# Patient Record
Sex: Female | Born: 1945 | Race: White | Hispanic: No | Smoking: Former smoker
Health system: Southern US, Community
[De-identification: ages and names within clinical notes are randomized; demographics above are authoritative.]

## PROBLEM LIST (undated history)

## (undated) DIAGNOSIS — E079 Disorder of thyroid, unspecified: Secondary | ICD-10-CM

## (undated) DIAGNOSIS — M1990 Unspecified osteoarthritis, unspecified site: Secondary | ICD-10-CM

## (undated) DIAGNOSIS — R0600 Dyspnea, unspecified: Secondary | ICD-10-CM

## (undated) DIAGNOSIS — J45909 Unspecified asthma, uncomplicated: Secondary | ICD-10-CM

## (undated) DIAGNOSIS — R7303 Prediabetes: Secondary | ICD-10-CM

## (undated) DIAGNOSIS — I10 Essential (primary) hypertension: Secondary | ICD-10-CM

## (undated) DIAGNOSIS — I499 Cardiac arrhythmia, unspecified: Secondary | ICD-10-CM

## (undated) DIAGNOSIS — C801 Malignant (primary) neoplasm, unspecified: Secondary | ICD-10-CM

## (undated) DIAGNOSIS — Z9889 Other specified postprocedural states: Secondary | ICD-10-CM

## (undated) DIAGNOSIS — R112 Nausea with vomiting, unspecified: Secondary | ICD-10-CM

## (undated) DIAGNOSIS — T8859XA Other complications of anesthesia, initial encounter: Secondary | ICD-10-CM

## (undated) DIAGNOSIS — F419 Anxiety disorder, unspecified: Secondary | ICD-10-CM

## (undated) DIAGNOSIS — E039 Hypothyroidism, unspecified: Secondary | ICD-10-CM

## (undated) DIAGNOSIS — E785 Hyperlipidemia, unspecified: Secondary | ICD-10-CM

## (undated) HISTORY — PX: THYROIDECTOMY: SHX17

## (undated) HISTORY — PX: COLONOSCOPY: SHX174

## (undated) HISTORY — PX: OTHER SURGICAL HISTORY: SHX169

---

## 2011-05-10 ENCOUNTER — Ambulatory Visit: Payer: Self-pay | Admitting: Otolaryngology

## 2011-05-10 DIAGNOSIS — I10 Essential (primary) hypertension: Secondary | ICD-10-CM

## 2011-05-14 ENCOUNTER — Ambulatory Visit: Payer: Self-pay | Admitting: Otolaryngology

## 2011-05-16 LAB — PATHOLOGY REPORT

## 2013-02-03 ENCOUNTER — Ambulatory Visit: Payer: Self-pay | Admitting: Gastroenterology

## 2013-02-04 LAB — PATHOLOGY REPORT

## 2013-05-05 ENCOUNTER — Ambulatory Visit: Payer: BC Managed Care – PPO | Admitting: Internal Medicine

## 2015-08-11 ENCOUNTER — Ambulatory Visit: Payer: Medicare Other | Admitting: Occupational Therapy

## 2015-08-18 ENCOUNTER — Ambulatory Visit: Payer: Medicare Other | Attending: Rheumatology | Admitting: Occupational Therapy

## 2015-08-18 DIAGNOSIS — M6281 Muscle weakness (generalized): Secondary | ICD-10-CM

## 2015-08-18 DIAGNOSIS — M25632 Stiffness of left wrist, not elsewhere classified: Secondary | ICD-10-CM | POA: Diagnosis present

## 2015-08-18 DIAGNOSIS — M25641 Stiffness of right hand, not elsewhere classified: Secondary | ICD-10-CM | POA: Diagnosis present

## 2015-08-18 DIAGNOSIS — M79641 Pain in right hand: Secondary | ICD-10-CM | POA: Insufficient documentation

## 2015-08-18 DIAGNOSIS — M25532 Pain in left wrist: Secondary | ICD-10-CM | POA: Diagnosis present

## 2015-08-18 NOTE — Patient Instructions (Signed)
Contrast bath 3 min heat , 1 min ice x 2 and heat 3 min bilateral hands Tendon glides x 8 Med N glides  X 5  Joint protection and AE education done and hand out provided  Splint wearing for wrist  MC block splint for R 4th to use at night and composite fist use

## 2015-08-18 NOTE — Therapy (Signed)
[facility] [address], [place], [address] Phone: [phone]   Fax:  [phone]  Occupational Therapy Treatment  Patient Details  Name: Brittany Wheeler MRN: [id number] Date of Birth: May 01, 1946 Referring Provider: Jefm Bryant  Encounter Date: 08/18/2015      OT End of Session - 08/18/15 1453    Visit Number 1   Number of Visits 6   Date for OT Re-Evaluation 09/08/15   OT Start Time 0850   OT Stop Time 0946   OT Time Calculation (min) 56 min   Activity Tolerance Patient tolerated treatment well   Behavior During Therapy [facility] for tasks assessed/performed      No past medical history on file.  No past surgical history on file.  There were no vitals filed for this visit.  Visit Diagnosis:  Pain of right hand - Plan: Ot plan of care cert/re-cert  Pain in wrist joint, left - Plan: Ot plan of care cert/re-cert  Stiffness of finger joint of right hand - Plan: Ot plan of care cert/re-cert  Stiffness of left wrist joint - Plan: Ot plan of care cert/re-cert  Muscle weakness - Plan: Ot plan of care cert/re-cert      Subjective Assessment - 08/18/15 1434    Subjective  Pain in my R hand and wrist started about May 2016 , and then my L wrist too - but it feels better after the shot Dr Jefm Bryant gave me 27th Dec -and bilateral wrist splints -  my finger did not lock since then until this am quickly -    Patient Stated Goals Want my pain in hands and wrist to be better and not come back - get my range and strength better    Currently in Pain? Yes   Pain Score 1    Pain Location Finger (Comment which one)   Pain Orientation Right   Pain Descriptors / Indicators Aching   Pain Type Chronic pain   Pain Onset 1 to 4 weeks ago   Pain Frequency Constant            [facility] OT Assessment - 08/18/15 0001    Assessment   Diagnosis Trigger finger R 4th , bilateral CTS , pain in hands and wrists   Referring  Provider kernodle   Onset Date 12/05/14   Assessment Pt had pain and locking of R ring finger since May of last year - seen Dr Jefm Bryant last month and had shot for trigger finger and blateral wrist splints for CTS - pt refer to hand thepay - pain better  but still present   Balance Screen   Has the patient fallen in the past 6 months No   Has the patient had a decrease in activity level because of a fear of falling?  No   Is the patient reluctant to leave their home because of a fear of falling?  No   Home  Environment   Lives With Alone   Prior Function   Level of Independence Independent   Leisure R hand dominant, play on her IPAD, walk her 2 dogs, house work ,   AROM   Right Wrist Extension 63 Degrees   Right Wrist Flexion 65 Degrees   Right Wrist Radial Deviation 20 Degrees   Right Wrist Ulnar Deviation 30 Degrees   Left Wrist Extension 58 Degrees   Left Wrist Flexion 50 Degrees   Left Wrist Radial Deviation 10 Degrees   Left Wrist  Ulnar Deviation 30 Degrees   Strength   Right Hand Grip (lbs) 35   Right Hand Lateral Pinch 11 lbs   Right Hand 3 Point Pinch 7 lbs   Left Hand Grip (lbs) 42   Left Hand Lateral Pinch 13 lbs   Left Hand 3 Point Pinch 9 lbs   Right Hand AROM   R Index  MCP 0-90 66 Degrees   R Long  MCP 0-90 60 Degrees   R Ring  MCP 0-90 70 Degrees   R Little  MCP 0-90 78 Degrees   Left Hand AROM   L Index  MCP 0-90 65 Degrees   L Long  MCP 0-90 85 Degrees   L Ring  MCP 0-90 85 Degrees   L Little  MCP 0-90 85 Degrees         Contrast done to L wrist , and R hand prior to review of HEP to decrease pain and increase ROM  Ed on HEP                  OT Education - 08-23-2015 1452    Education provided Yes   Person(s) Educated Patient   Methods Explanation;Demonstration;Tactile cues;Verbal cues;Handout   Comprehension Verbal cues required;Returned demonstration;Verbalized understanding          OT Short Term Goals - 08-23-15 1457    OT  SHORT TERM GOAL #1   Title Pain on PRWHE improve with 10 points on PRWHE    Baseline Pain on PRWHE for pain at eval was 25/50   Time 2   Period Weeks   Status New   OT SHORT TERM GOAL #2   Title Pt to be ind in HEP to decrease pain and increase ROM at L wrist and R MC's to cut food and turn doorknob with more ease    Baseline no knowledge on HEP   Time 2   Period Weeks   Status New           OT Long Term Goals - 08/23/2015 1458    OT LONG TERM GOAL #1   Title Pt verbalize 3 joint protection principles and AE to increase ease and decrease pain use hands in ADL's    Baseline very litte knowledge    Time 3   Period Weeks   Status New               Plan - 08/23/2015 1454    Clinical Impression Statement Pt present with increase pain in L wrist, R hand - mostly 4th over A1 pulley , MC's - pt present with decrease AROM in R hand diigits and L wrist - as well as decrease grip and prehension - all limiting her functional use of L and R hand in ADL's and IADL's    Pt will benefit from skilled therapeutic intervention in order to improve on the following deficits (Retired) Decreased strength;Impaired UE functional use;Pain;Impaired flexibility;Decreased range of motion   Rehab Potential Fair   OT Frequency 2x / week   OT Duration 2 weeks   OT Treatment/Interventions Self-care/ADL training;Parrafin;Ultrasound;Contrast Bath;DME and/or AE instruction;Splinting;Patient/family education;Therapeutic exercises;Manual Therapy   Plan Assess pain , ROM improve and if implement joint protection    OT Home Exercise Plan see pt instruction   Consulted and Agree with Plan of Care Patient          G-Codes - August 23, 2015 1500    Functional Assessment Tool Used ROM , strength, pain scale, PRWHE , clinical judgement  Functional Limitation Self care   Self Care Current Status [phone]) At least 40 percent but less than 60 percent impaired, limited or restricted   Self Care Goal Status ([id number]) At  least 1 percent but less than 20 percent impaired, limited or restricted      Problem List There are no active problems to display for this patient.   Rosalyn Gess OTR/L,CLT 08/18/2015, 3:03 PM  [facility] [address] [place], [address] Phone: [phone]   Fax:  [phone]  Name: Brittany Wheeler MRN: [id number] Date of Birth: 10-22-1945

## 2015-08-24 ENCOUNTER — Ambulatory Visit: Payer: Medicare Other | Admitting: Occupational Therapy

## 2015-08-26 ENCOUNTER — Ambulatory Visit: Payer: Medicare Other | Admitting: Occupational Therapy

## 2015-08-26 DIAGNOSIS — M25641 Stiffness of right hand, not elsewhere classified: Secondary | ICD-10-CM

## 2015-08-26 DIAGNOSIS — M25532 Pain in left wrist: Secondary | ICD-10-CM

## 2015-08-26 DIAGNOSIS — M79641 Pain in right hand: Secondary | ICD-10-CM

## 2015-08-26 DIAGNOSIS — M6281 Muscle weakness (generalized): Secondary | ICD-10-CM

## 2015-08-26 DIAGNOSIS — M25632 Stiffness of left wrist, not elsewhere classified: Secondary | ICD-10-CM

## 2015-08-26 NOTE — Patient Instructions (Signed)
Same HEP - keep in pain free range the exercises  Joint protection  Contrast if have CT symptoms  Wrist splints for CT as needed

## 2015-08-26 NOTE — Therapy (Signed)
[facility] [address], [place], [address] Phone: [phone]   Fax:  [phone]  Occupational Therapy Treatment  Patient Details  Name: Brittany Wheeler MRN: [id number] Date of Birth: 1945/09/02 Referring Provider: Jefm Bryant  Encounter Date: 08/26/2015      OT End of Session - 08/26/15 1202    Visit Number 2   Number of Visits 2   Date for OT Re-Evaluation 09/08/15   OT Start Time 0845   OT Stop Time 0920   OT Time Calculation (min) 35 min   Activity Tolerance Patient tolerated treatment well   Behavior During Therapy [facility] for tasks assessed/performed      No past medical history on file.  No past surgical history on file.  There were no vitals filed for this visit.  Visit Diagnosis:  Pain of right hand  Pain in wrist joint, left  Stiffness of finger joint of right hand  Stiffness of left wrist joint  Muscle weakness      Subjective Assessment - 08/26/15 0858    Subjective  My R hand is so much better- my L wrist hurting some but only 1/10 - I stop wearing my splints the last 2 night - last night did not had any numbness - prevoius night little - splints fit okay    Patient Stated Goals Want my pain in hands and wrist to be better and not come back - get my range and strength better    Currently in Pain? Yes   Pain Score 1    Pain Location Wrist   Pain Orientation Left            [facility] OT Assessment - 08/26/15 0001    AROM   Right Wrist Extension 63 Degrees   Right Wrist Flexion 73 Degrees   Right Wrist Radial Deviation 20 Degrees   Right Wrist Ulnar Deviation 30 Degrees   Left Wrist Extension 61 Degrees   Left Wrist Flexion 55 Degrees   Left Wrist Radial Deviation 15 Degrees   Left Wrist Ulnar Deviation 30 Degrees                  OT Treatments/Exercises (OP) - 08/26/15 0001    ADLs   ADL Comments Reviewed joint protection principles and reinfoce - pt verbalise  she started using it - large joint , avoid statiic thigt grip    Wrist Exercises   Other wrist exercises AROM on L wrist in RD/UD and wrist flexion and extention  pt to relax hand - not tense up - and close hand for wrist extention -  ROM improved but still some pain on lL wrist - pt to  do in pain free range after heat     Hand Exercises   Other Hand Exercises Tendonglides - seperate steops - quick composite fist - bottom of palm    Other Hand Exercises Support wrist during tendonlides that wrist do not flex   LUE Paraffin   Number Minutes Paraffin 10 Minutes   LUE Paraffin Location Wrist;Hand   Comments parafin with heatingpad prior to ROM -                 OT Education - 08/26/15 1202    Education provided Yes   Education Details HEP   Person(s) Educated Patient   Methods Explanation;Demonstration;Tactile cues;Handout   Comprehension Returned demonstration;Verbalized understanding;Verbal cues required          OT Short  Term Goals - 08/26/15 1208    OT SHORT TERM GOAL #1   Title Pain on PRWHE improve with 10 points on PRWHE    Baseline Pain now 6/50 ; was at eval 25/50   Status Achieved   OT SHORT TERM GOAL #2   Title Pt to be ind in HEP to decrease pain and increase ROM at L wrist and R MC's to cut food and turn doorknob with more ease    Baseline ind in HEP and progress in MC's flexion and wrists   Status Achieved           OT Long Term Goals - 08/26/15 1208    OT LONG TERM GOAL #1   Title Pt verbalize 3 joint protection principles and AE to increase ease and decrease pain use hands in ADL's    Baseline Pt uisng large joints , avoid tight sustained grips - use AE -   Status Achieved               Plan - 08/26/15 1206    Clinical Impression Statement Pt made great gains in pain on R hand , no triggering since eval week ago - pt was ed on modifictaions of task and joint protection - pt still have some pain at wrist but at the worse 3/10 but at rest  0/10 - pt met all goals - progerss in ROM at wrist and digits - pt discharge with HEP    OT Treatment/Interventions Self-care/ADL training;Parrafin;Ultrasound;Contrast Bath;DME and/or AE instruction;Splinting;Patient/family education;Therapeutic exercises;Manual Therapy   Plan DIscharge with HEP    OT Home Exercise Plan see pt instruction   Consulted and Agree with Plan of Care Patient        Problem List There are no active problems to display for this patient.   Rosalyn Gess OTR/L,CLT 08/26/2015, 12:10 PM  [facility] [address] [place], [address] Phone: [phone]   Fax:  [phone]  Name: Brittany Wheeler MRN: [id number] Date of Birth: 1946-08-01

## 2017-05-30 ENCOUNTER — Other Ambulatory Visit: Payer: Self-pay | Admitting: Otolaryngology

## 2017-05-30 DIAGNOSIS — H9311 Tinnitus, right ear: Secondary | ICD-10-CM

## 2017-06-12 ENCOUNTER — Ambulatory Visit
Admission: RE | Admit: 2017-06-12 | Discharge: 2017-06-12 | Disposition: A | Discharge status: Home / Self Care | Payer: Medicare Other | Source: Ambulatory Visit | Attending: Otolaryngology | Admitting: Otolaryngology

## 2017-06-12 ENCOUNTER — Encounter: Payer: Self-pay | Admitting: Radiology

## 2017-06-12 DIAGNOSIS — H9311 Tinnitus, right ear: Secondary | ICD-10-CM | POA: Diagnosis present

## 2017-06-12 DIAGNOSIS — G939 Disorder of brain, unspecified: Secondary | ICD-10-CM | POA: Diagnosis not present

## 2017-06-12 DIAGNOSIS — I6782 Cerebral ischemia: Secondary | ICD-10-CM | POA: Insufficient documentation

## 2017-06-12 LAB — POCT I-STAT CREATININE: Creatinine, Ser: 0.8 mg/dL (ref 0.44–1.00)

## 2017-06-12 MED ORDER — GADOBENATE DIMEGLUMINE 529 MG/ML IV SOLN
14.0000 mL | Freq: Once | INTRAVENOUS | Status: AC | PRN
  Administered 2017-06-12: 14 mL via INTRAVENOUS

## 2021-02-27 ENCOUNTER — Inpatient Hospital Stay
Admission: EM | Admit: 2021-02-27 | Discharge: 2021-03-01 | DRG: 180 | Disposition: A | Discharge status: Home / Self Care | Payer: Medicare Other | Attending: Hospitalist | Admitting: Hospitalist

## 2021-02-27 ENCOUNTER — Emergency Department: Payer: Medicare Other

## 2021-02-27 ENCOUNTER — Other Ambulatory Visit: Payer: Self-pay

## 2021-02-27 DIAGNOSIS — Z66 Do not resuscitate: Secondary | ICD-10-CM | POA: Diagnosis present

## 2021-02-27 DIAGNOSIS — Z87891 Personal history of nicotine dependence: Secondary | ICD-10-CM

## 2021-02-27 DIAGNOSIS — Z800 Family history of malignant neoplasm of digestive organs: Secondary | ICD-10-CM | POA: Diagnosis not present

## 2021-02-27 DIAGNOSIS — E079 Disorder of thyroid, unspecified: Secondary | ICD-10-CM | POA: Diagnosis present

## 2021-02-27 DIAGNOSIS — E785 Hyperlipidemia, unspecified: Secondary | ICD-10-CM | POA: Diagnosis present

## 2021-02-27 DIAGNOSIS — R0602 Shortness of breath: Secondary | ICD-10-CM | POA: Diagnosis present

## 2021-02-27 DIAGNOSIS — R634 Abnormal weight loss: Secondary | ICD-10-CM | POA: Diagnosis present

## 2021-02-27 DIAGNOSIS — I480 Paroxysmal atrial fibrillation: Secondary | ICD-10-CM | POA: Diagnosis present

## 2021-02-27 DIAGNOSIS — Z79899 Other long term (current) drug therapy: Secondary | ICD-10-CM | POA: Diagnosis not present

## 2021-02-27 DIAGNOSIS — J440 Chronic obstructive pulmonary disease with acute lower respiratory infection: Secondary | ICD-10-CM | POA: Diagnosis present

## 2021-02-27 DIAGNOSIS — R918 Other nonspecific abnormal finding of lung field: Secondary | ICD-10-CM | POA: Diagnosis present

## 2021-02-27 DIAGNOSIS — D630 Anemia in neoplastic disease: Secondary | ICD-10-CM | POA: Diagnosis present

## 2021-02-27 DIAGNOSIS — Z23 Encounter for immunization: Secondary | ICD-10-CM | POA: Diagnosis present

## 2021-02-27 DIAGNOSIS — J449 Chronic obstructive pulmonary disease, unspecified: Secondary | ICD-10-CM | POA: Diagnosis not present

## 2021-02-27 DIAGNOSIS — J9600 Acute respiratory failure, unspecified whether with hypoxia or hypercapnia: Secondary | ICD-10-CM | POA: Diagnosis present

## 2021-02-27 DIAGNOSIS — J189 Pneumonia, unspecified organism: Secondary | ICD-10-CM | POA: Diagnosis not present

## 2021-02-27 DIAGNOSIS — F32A Depression, unspecified: Secondary | ICD-10-CM | POA: Diagnosis present

## 2021-02-27 DIAGNOSIS — Z91040 Latex allergy status: Secondary | ICD-10-CM | POA: Diagnosis not present

## 2021-02-27 DIAGNOSIS — Z20822 Contact with and (suspected) exposure to covid-19: Secondary | ICD-10-CM | POA: Diagnosis present

## 2021-02-27 DIAGNOSIS — R630 Anorexia: Secondary | ICD-10-CM | POA: Diagnosis present

## 2021-02-27 DIAGNOSIS — E890 Postprocedural hypothyroidism: Secondary | ICD-10-CM | POA: Diagnosis present

## 2021-02-27 DIAGNOSIS — Z79890 Hormone replacement therapy (postmenopausal): Secondary | ICD-10-CM

## 2021-02-27 DIAGNOSIS — Z880 Allergy status to penicillin: Secondary | ICD-10-CM | POA: Diagnosis not present

## 2021-02-27 DIAGNOSIS — C3490 Malignant neoplasm of unspecified part of unspecified bronchus or lung: Principal | ICD-10-CM | POA: Diagnosis present

## 2021-02-27 DIAGNOSIS — C771 Secondary and unspecified malignant neoplasm of intrathoracic lymph nodes: Secondary | ICD-10-CM | POA: Diagnosis present

## 2021-02-27 DIAGNOSIS — I10 Essential (primary) hypertension: Secondary | ICD-10-CM | POA: Diagnosis present

## 2021-02-27 HISTORY — DX: Hyperlipidemia, unspecified: E78.5

## 2021-02-27 HISTORY — DX: Essential (primary) hypertension: I10

## 2021-02-27 HISTORY — DX: Disorder of thyroid, unspecified: E07.9

## 2021-02-27 LAB — BASIC METABOLIC PANEL
Anion gap: 8 (ref 5–15)
BUN: 17 mg/dL (ref 8–23)
CO2: 25 mmol/L (ref 22–32)
Calcium: 10.4 mg/dL — ABNORMAL HIGH (ref 8.9–10.3)
Chloride: 101 mmol/L (ref 98–111)
Creatinine, Ser: 0.87 mg/dL (ref 0.44–1.00)
GFR, Estimated: >60 mL/min (ref >60)
Glucose, Bld: 127 mg/dL — ABNORMAL HIGH (ref 70–99)
Potassium: 3.9 mmol/L (ref 3.5–5.1)
Sodium: 134 mmol/L — ABNORMAL LOW (ref 135–145)

## 2021-02-27 LAB — BRAIN NATRIURETIC PEPTIDE: B Natriuretic Peptide: 52 pg/mL (ref 0.0–100.0)

## 2021-02-27 LAB — CBC
HCT: 36.4 % (ref 36.0–46.0)
Hemoglobin: 12 g/dL (ref 12.0–15.0)
MCH: 29.3 pg (ref 26.0–34.0)
MCHC: 33 g/dL (ref 30.0–36.0)
MCV: 89 fL (ref 80.0–100.0)
Platelets: 406 10*3/uL — ABNORMAL HIGH (ref 150–400)
RBC: 4.09 MIL/uL (ref 3.87–5.11)
RDW: 13.3 % (ref 11.5–15.5)
WBC: 8.8 10*3/uL (ref 4.0–10.5)
nRBC: 0 % (ref 0.0–0.2)

## 2021-02-27 LAB — RESP PANEL BY RT-PCR (FLU A&B, COVID) ARPGX2
Influenza A by PCR: NEGATIVE
Influenza B by PCR: NEGATIVE
SARS Coronavirus 2 by RT PCR: NEGATIVE

## 2021-02-27 LAB — PROCALCITONIN: Procalcitonin: <0.1 ng/mL

## 2021-02-27 LAB — APTT: aPTT: 26 seconds (ref 24–36)

## 2021-02-27 LAB — PROTIME-INR
INR: 1 (ref 0.8–1.2)
Prothrombin Time: 13.1 seconds (ref 11.4–15.2)

## 2021-02-27 LAB — TROPONIN I (HIGH SENSITIVITY): Troponin I (High Sensitivity): 12 ng/L (ref <18)

## 2021-02-27 MED ORDER — HYDRALAZINE HCL 20 MG/ML IJ SOLN
10.0000 mg | Freq: Four times a day (QID) | INTRAMUSCULAR | Status: DC | PRN
  Administered 2021-02-27 – 2021-02-28 (×2): 10 mg via INTRAVENOUS
  Filled 2021-02-27 (×2): qty 1

## 2021-02-27 MED ORDER — SODIUM CHLORIDE 0.9 % IV SOLN
500.0000 mg | INTRAVENOUS | Status: DC
  Administered 2021-02-27 – 2021-02-28 (×2): 500 mg via INTRAVENOUS
  Filled 2021-02-27 (×4): qty 500

## 2021-02-27 MED ORDER — ADULT MULTIVITAMIN W/MINERALS CH
1.0000 | ORAL_TABLET | Freq: Every day | ORAL | Status: DC
  Administered 2021-02-27 – 2021-03-01 (×3): 1 via ORAL
  Filled 2021-02-27 (×3): qty 1

## 2021-02-27 MED ORDER — ACETAMINOPHEN 650 MG RE SUPP: 650.0000 mg | Freq: Four times a day (QID) | RECTAL | Status: DC | PRN

## 2021-02-27 MED ORDER — FLUTICASONE PROPIONATE 50 MCG/ACT NA SUSP
1.0000 | Freq: Every day | NASAL | Status: DC
  Administered 2021-02-28 – 2021-03-01 (×2): 1 via NASAL
  Filled 2021-02-27 (×2): qty 16

## 2021-02-27 MED ORDER — ACETAMINOPHEN 325 MG PO TABS
650.0000 mg | ORAL_TABLET | Freq: Four times a day (QID) | ORAL | Status: DC | PRN
  Administered 2021-02-28: 650 mg via ORAL
  Filled 2021-02-27: qty 2

## 2021-02-27 MED ORDER — DILTIAZEM LOAD VIA INFUSION: 10.0000 mg | Freq: Once | INTRAVENOUS | Status: DC

## 2021-02-27 MED ORDER — LEVOTHYROXINE SODIUM 112 MCG PO TABS
112.0000 ug | ORAL_TABLET | Freq: Every day | ORAL | Status: DC
  Administered 2021-02-28 – 2021-03-01 (×2): 112 ug via ORAL
  Filled 2021-02-27 (×4): qty 1

## 2021-02-27 MED ORDER — ONDANSETRON HCL 4 MG PO TABS: 4.0000 mg | ORAL_TABLET | Freq: Four times a day (QID) | ORAL | Status: DC | PRN

## 2021-02-27 MED ORDER — IPRATROPIUM-ALBUTEROL 0.5-2.5 (3) MG/3ML IN SOLN: 3.0000 mL | Freq: Four times a day (QID) | RESPIRATORY_TRACT | Status: DC | PRN

## 2021-02-27 MED ORDER — PAROXETINE HCL 20 MG PO TABS
20.0000 mg | ORAL_TABLET | Freq: Every day | ORAL | Status: DC
  Administered 2021-02-27 – 2021-03-01 (×3): 20 mg via ORAL
  Filled 2021-02-27 (×3): qty 1

## 2021-02-27 MED ORDER — HYDROCHLOROTHIAZIDE 25 MG PO TABS
12.5000 mg | ORAL_TABLET | Freq: Every day | ORAL | Status: DC
  Administered 2021-02-27 – 2021-02-28 (×2): 12.5 mg via ORAL
  Filled 2021-02-27 (×2): qty 1

## 2021-02-27 MED ORDER — BUDESON-GLYCOPYRROL-FORMOTEROL 160-9-4.8 MCG/ACT IN AERO: 1.0000 | INHALATION_SPRAY | Freq: Two times a day (BID) | RESPIRATORY_TRACT | Status: DC

## 2021-02-27 MED ORDER — ONDANSETRON HCL 4 MG/2ML IJ SOLN: 4.0000 mg | Freq: Four times a day (QID) | INTRAMUSCULAR | Status: DC | PRN

## 2021-02-27 MED ORDER — IOHEXOL 350 MG/ML SOLN
75.0000 mL | Freq: Once | INTRAVENOUS | Status: AC | PRN
  Administered 2021-02-27: 75 mL via INTRAVENOUS

## 2021-02-27 MED ORDER — DILTIAZEM HCL 25 MG/5ML IV SOLN
10.0000 mg | Freq: Once | INTRAVENOUS | Status: AC
  Administered 2021-02-27: 10 mg via INTRAVENOUS
  Filled 2021-02-27: qty 5

## 2021-02-27 MED ORDER — CETIRIZINE HCL 10 MG PO TABS
10.0000 mg | ORAL_TABLET | Freq: Every day | ORAL | Status: DC | PRN
  Filled 2021-02-27: qty 1

## 2021-02-27 NOTE — ED Notes (Signed)
Pt assisted to bathroom. Pt also made aware that her friend Brittany Wheeler wanted a call from her.

## 2021-02-27 NOTE — ED Notes (Signed)
Pt resting comfortably. Equal rise and fall of the chest respirations even and unlabored. NAD noted. Will continue to monitor.

## 2021-02-27 NOTE — ED Notes (Signed)
Pt heart rate 130's 140's, Dr. Francine Graven notified and cardizem ordered. EKG done.

## 2021-02-27 NOTE — ED Notes (Signed)
Pt placed on a hospital bed

## 2021-02-27 NOTE — Progress Notes (Signed)
   02/27/21 1505  Clinical Encounter Type  Visited With Patient;Other (Comment) (neighbor)  Visit Type Follow-up;Other (Comment) (Advanced directive)  Referral From Nurse  Recommendations Pt requested an Advanced directive  Spiritual Encounters  Spiritual Needs Other (Comment) (Advanced directive; DNR oder request)  Advance Directives (For Healthcare)  Does Patient Have a Medical Advance Directive? Yes (Completed 02/27/21)  Daryel November facilitated completion of [place] form and notarization; this document has been added to Pt's paper chart.

## 2021-02-27 NOTE — ED Notes (Signed)
Pt BP elevated and Dr. Francine Graven notified. Hydralazine ordered and given at this time. Will continue to monitor.

## 2021-02-27 NOTE — ED Triage Notes (Signed)
Pt c/o increased SOB over the past 2 weeks, has been seen for a mass on her lungs, pt is out of breath walking into triage.

## 2021-02-27 NOTE — ED Notes (Signed)
Pt called out states she wants to go home. This nurse spoke with the patient and she will stay for her test tomorrow. Pt really wants to get home to see her dogs. Will message provider about sleep aids.

## 2021-02-27 NOTE — H&P (Signed)
History and Physical    Brittany Wheeler YNW:[id number] DOB: 03-22-1946 DOA: 02/27/2021  PCP: [name], MD   Patient coming from: Home  I have personally briefly reviewed patient's old medical records in [place]  Chief Complaint: Shortness of breath  HPI: Brittany Wheeler is a 75 y.o. female with medical history significant for hypothyroidism, hypertension, dyslipidemia and depression who presents to the emergency room for evaluation of worsening shortness of breath with minimal exertion. Patient states that she has had symptoms for over 2 weeks and initially was short of breath with moderate exertion but now even at rest she is short of breath.  Shortness of breath is associated with a cough productive of yellow phlegm.  She denies having any fever or chills, no night sweats, no leg swelling, no orthopnea or paroxysmal nocturnal dyspnea. She complains of anorexia and has had a 20 pound weight loss in the last 2 months. Patient had an outpatient chest x-ray done due to persistence of her symptoms and it showed a right lung mass.  She was referred to pulmonology as an outpatient. Patient was prescribed some inhalers by the pulmonologist and there was a plan for an outpatient bronchoscopy and further imaging studies.  She had a pulmonary function test which showed an obstructive pattern. Patient presents to the emergency room today for evaluation of worsening symptoms and had a CT angiogram of the chest to rule out PE and it showed a large bulky central right middle lobe mass measuring up to 7 cm compatible with a primary bronchogenic carcinoma with evidence of bulky metastatic mediastinal adenopathy and suspected surrounding right middle lobe and right upper lobe lymphangitic tumor spread.  Also enlarged upper abdominal gastrohepatic lymph node suspicious for metastatic disease.   She denies having any headache, no blurred vision, no dizziness, no lightheadedness, no changes in  her bowel habits, no urinary symptoms, no focal deficits, no blurred vision. Labs show sodium 134, potassium 3.9, chloride 101, bicarb 25, glucose 127, BUN 17, creatinine 0.87, calcium 10.4, BNP 52, troponin 12, procalcitonin less than 0.10, white count 8.8, hemoglobin 12.0, hematocrit 36.4, RDW 13.3, platelet count 406, PT 13.1, INR 1.0 Respiratory viral panel is pending Twelve-lead EKG reviewed by me shows sinus tachycardia, low voltage QRS   ED Course: Patient is a 75 year old female who presents to the ER for evaluation of worsening shortness of breath associated with a cough productive of orange phlegm and wheezing. Imaging shows anemia right middle lobe mass measuring up to 7 cm compatible with a primary bronchogenic carcinoma. She is currently on 2 L of oxygen for comfort. She will be admitted to the hospital for further evaluation.     Review of Systems: As per HPI otherwise all other systems reviewed and negative.    Past Medical History:  Diagnosis Date   Hyperlipidemia    Hypertension    Thyroid disease     Past Surgical History:  Procedure Laterality Date   THYROIDECTOMY       reports that she has quit smoking. Her smoking use included cigarettes. She has never used smokeless tobacco. She reports previous alcohol use. No history on file for drug use.  Allergies  Allergen Reactions   Latex    Penicillins     Family History  Problem Relation Age of Onset   Mesothelioma Father    Rectal cancer Sister       Prior to Admission medications   Not on File    Physical Exam: Vitals:  02/27/21 0945 02/27/21 0946 02/27/21 1338  BP: (!) 174/125  (!) 181/99  Pulse: (!) 120  (!) 101  Resp: (!) 24  14  Temp: 97.6 F (36.4 C)    TempSrc: Oral    SpO2: 94%  95%  Weight:  68 kg   Height:  5\' 1"  (1.549 m)      Vitals:   02/27/21 0945 02/27/21 0946 02/27/21 1338  BP: (!) 174/125  (!) 181/99  Pulse: (!) 120  (!) 101  Resp: (!) 24  14  Temp: 97.6 F (36.4 C)     TempSrc: Oral    SpO2: 94%  95%  Weight:  68 kg   Height:  5\' 1"  (1.549 m)       Constitutional: Alert and oriented x 3 . Not in any apparent distress HEENT:      Head: Normocephalic and atraumatic.         Eyes: PERLA, EOMI, Conjunctivae are normal. Sclera is non-icteric.       Mouth/Throat: Mucous membranes are moist.       Neck: Supple with no signs of meningismus. Cardiovascular: Tachycardic. No murmurs, gallops, or rubs. 2+ symmetrical distal pulses are present . No JVD. No LE edema Respiratory: Tachypneic.air movement in all lung fields, scattered wheezes in all lung fields . No wheezes or rhonchi.  Gastrointestinal: Soft, non tender, and non distended with positive bowel sounds.  Genitourinary: No CVA tenderness. Musculoskeletal: Nontender with normal range of motion in all extremities. No cyanosis, or erythema of extremities. Neurologic:  Face is symmetric. Moving all extremities. No gross focal neurologic deficits . Skin: Skin is warm, dry.  No rash or ulcers Psychiatric: Mood and affect are normal    Labs on Admission: I have personally reviewed following labs and imaging studies  CBC: Recent Labs  Lab 02/27/21 1011  WBC 8.8  HGB 12.0  HCT 36.4  MCV 89.0  PLT 782*   Basic Metabolic Panel: Recent Labs  Lab 02/27/21 1011  NA 134*  K 3.9  CL 101  CO2 25  GLUCOSE 127*  BUN 17  CREATININE 0.87  CALCIUM 10.4*   GFR: Estimated Creatinine Clearance: 50.8 mL/min (by C-G formula based on SCr of 0.87 mg/dL). Liver Function Tests: No results for input(s): AST, ALT, ALKPHOS, BILITOT, PROT, ALBUMIN in the last 168 hours. No results for input(s): LIPASE, AMYLASE in the last 168 hours. No results for input(s): AMMONIA in the last 168 hours. Coagulation Profile: Recent Labs  Lab 02/27/21 1011  INR 1.0   Cardiac Enzymes: No results for input(s): CKTOTAL, CKMB, CKMBINDEX, TROPONINI in the last 168 hours. BNP (last 3 results) No results for input(s): PROBNP  in the last 8760 hours. HbA1C: No results for input(s): HGBA1C in the last 72 hours. CBG: No results for input(s): GLUCAP in the last 168 hours. Lipid Profile: No results for input(s): CHOL, HDL, LDLCALC, TRIG, CHOLHDL, LDLDIRECT in the last 72 hours. Thyroid Function Tests: No results for input(s): TSH, T4TOTAL, FREET4, T3FREE, THYROIDAB in the last 72 hours. Anemia Panel: No results for input(s): VITAMINB12, FOLATE, FERRITIN, TIBC, IRON, RETICCTPCT in the last 72 hours. Urine analysis: No results found for: COLORURINE, APPEARANCEUR, LABSPEC, PHURINE, GLUCOSEU, HGBUR, BILIRUBINUR, KETONESUR, PROTEINUR, UROBILINOGEN, NITRITE, LEUKOCYTESUR  Radiological Exams on Admission: CT Angio Chest PE W and/or Wo Contrast  Result Date: 02/27/2021 CLINICAL DATA:  Chest pain and shortness of breath, wheezing EXAM: CT ANGIOGRAPHY CHEST WITH CONTRAST TECHNIQUE: Multidetector CT imaging of the chest was performed using the standard  protocol during bolus administration of intravenous contrast. Multiplanar CT image reconstructions and MIPs were obtained to evaluate the vascular anatomy. CONTRAST:  65mL OMNIPAQUE IOHEXOL 350 MG/ML SOLN COMPARISON:  None. FINDINGS: Cardiovascular: Thoracic aortic atherosclerosis without acute aneurysm or dissection. Patent 3 vessel arch anatomy. Pulmonary arteries appear patent without acute filling defect or pulmonary embolus. Segmental pulmonary branches to the right middle lobe are compressed by the right middle lobe mass described below. Normal heart size. Native coronary atherosclerosis noted. No pericardial effusion. Mediastinum/Nodes: Bulky right paratracheal adenopathy measures 6 x 5.3 cm. Enlarged central left AP window lymph node measures 2.8 x 3.1 cm. Subcarinal adenopathy measures 3.2 x 2.9 cm. These are compatible with mediastinal metastatic adenopathy. Suspect additional ill-defined and difficult to measure right hilar adenopathy. Lungs/Pleura: Large central bulky right  middle lobe mass measures approximately 7 x 4.7 x 6 cm. Mass does appear to invade into the right hilum with encasement of the central bronchial structures and abutting against the left atrium and right inferior pulmonary vein. 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 There is complete collapse or invasion of the right middle lobe bronchi. Bronchus intermedius also significantly encased and narrowed proximally. Central right lung surrounding interstitial micro nodularity suggesting right lung regional lymphangitic tumor spread in the right middle lobe and right upper lobe. Mild nonspecific right upper lobe patchy peribronchovascular airspace opacities. Left lung remains clear. Small non loculated right pleural effusion with associated right basilar subsegmental atelectasis. Upper Abdomen: Enlarged upper abdomen gastric hepatic ligament lymph node measures 1.6 cm, image 82/4. No acute upper abdominal finding. Abdominal atherosclerosis noted. Musculoskeletal: Degenerative changes of the spine. No acute osseous finding. No osseous lytic or blastic disease. Sternum intact. Review of the MIP images confirms the above findings. IMPRESSION: Large bulky central right middle lobe mass measuring up to 7 cm compatible with a primary bronchogenic carcinoma with evidence of bulky metastatic mediastinal adenopathy and suspect surrounding right middle lobe and right upper lobe lymphangitic tumor spread. Also enlarged upper abdominal gastrohepatic lymph nodes suspicious for metastatic disease. Associated trace right pleural effusion and right basilar atelectasis. Negative for significant acute pulmonary embolus. Aortic Atherosclerosis (ICD10-I70.0). These results were called by telephone at the time of interpretation on 02/27/2021 at 11:28 am to provider Dr. Alwyn Ren, who verbally acknowledged these results. Electronically Signed   By: Jerilynn Mages.  Shick M.D.   On: 02/27/2021 11:30     Assessment/Plan Principal  Problem:   Lung mass Active Problems:   Acute respiratory failure (HCC)   Hypertension   Thyroid disease   Depression       Lung mass Patient presents for evaluation of shortness of breath mostly with mild exertion and at rest associated with a cough productive of orange phlegm and wheezing CT angiogram of the chest shows a large bulky central right middle lobe mass measuring up to 7 cm compatible with a primary bronchogenic carcinoma with evidence of bulky metastatic mediastinal adenopathy and suspect surrounding right middle lobe and right upper lobe lymphangitic tumor spread.Also enlarged upper abdominal gastrohepatic lymph nodes suspicious for metastatic disease. We will request pulmonology consultation for bronchoscopy and biopsy Will consult oncology Supportive care with as needed bronchodilator therapy and inhaled steroids    Depression Continue Paxil    Hypothyroidism Continue Synthroid    Hypertension Continue hydrochlorothiazide As needed IV hydralazine for systolic blood pressure greater than 129mmHg     Acute bronchitis We will treat patient empirically with Zithromax Continue as needed bronchodilator therapy as well as inhaled  steroids.   DVT prophylaxis: SCD  Code Status: full code  Family Communication: Greater than 50% of time spent discussing patient's condition and plan of care with her and her healthcare power of attorney Ginny Ward at the bedside.  All questions and concerns have been addressed.  They verbalized understanding and agree with the plan.  CODE STATUS was discussed and she request to be DNR. Disposition Plan: Back to previous home environment Consults called: Pulmonology/oncology Status: At the time of admission, it appears that the appropriate admission status for this patient is inpatient. This is judged to be reasonable and necessary in order to provide the required intensity of service to ensure the patient's safety given the  presenting symptoms, physical exam findings, and initial radiographic and laboratory data in the context of their comorbid conditions. Patient requires inpatient status due to high intensity of service, high risk of further deterioration and high frequency of surveillance required.    Collier Bullock MD [facility]     02/27/2021, 3:07 PM

## 2021-02-27 NOTE — Progress Notes (Signed)
   02/27/21 1445  Clinical Encounter Type  Visited With Patient;Other (Comment) (neighbor)  Visit Type Initial;Spiritual support;Social support  Referral From Nurse  Consult/Referral To [place] checked-in with Brittany Wheeler at nurse's suggestion. Chaplain Burris offered compassionate presence and support. Brittany Wheeler primarily needed general support and hospitality; no further needs assessed at this time.

## 2021-02-28 ENCOUNTER — Encounter: Payer: Self-pay | Admitting: Internal Medicine

## 2021-02-28 ENCOUNTER — Inpatient Hospital Stay: Payer: Medicare Other

## 2021-02-28 DIAGNOSIS — J189 Pneumonia, unspecified organism: Secondary | ICD-10-CM

## 2021-02-28 DIAGNOSIS — R918 Other nonspecific abnormal finding of lung field: Secondary | ICD-10-CM

## 2021-02-28 DIAGNOSIS — R634 Abnormal weight loss: Secondary | ICD-10-CM

## 2021-02-28 DIAGNOSIS — J449 Chronic obstructive pulmonary disease, unspecified: Secondary | ICD-10-CM

## 2021-02-28 LAB — BASIC METABOLIC PANEL
Anion gap: 8 (ref 5–15)
BUN: 15 mg/dL (ref 8–23)
CO2: 27 mmol/L (ref 22–32)
Calcium: 10.3 mg/dL (ref 8.9–10.3)
Chloride: 99 mmol/L (ref 98–111)
Creatinine, Ser: 0.82 mg/dL (ref 0.44–1.00)
GFR, Estimated: >60 mL/min (ref >60)
Glucose, Bld: 135 mg/dL — ABNORMAL HIGH (ref 70–99)
Potassium: 3.5 mmol/L (ref 3.5–5.1)
Sodium: 134 mmol/L — ABNORMAL LOW (ref 135–145)

## 2021-02-28 LAB — CBC
HCT: 34.6 % — ABNORMAL LOW (ref 36.0–46.0)
Hemoglobin: 11.6 g/dL — ABNORMAL LOW (ref 12.0–15.0)
MCH: 30 pg (ref 26.0–34.0)
MCHC: 33.5 g/dL (ref 30.0–36.0)
MCV: 89.4 fL (ref 80.0–100.0)
Platelets: 413 10*3/uL — ABNORMAL HIGH (ref 150–400)
RBC: 3.87 MIL/uL (ref 3.87–5.11)
RDW: 13.5 % (ref 11.5–15.5)
WBC: 9.9 10*3/uL (ref 4.0–10.5)
nRBC: 0 % (ref 0.0–0.2)

## 2021-02-28 MED ORDER — ALPRAZOLAM 0.25 MG PO TABS: 0.2500 mg | ORAL_TABLET | Freq: Three times a day (TID) | ORAL | Status: DC | PRN

## 2021-02-28 MED ORDER — DILTIAZEM HCL 30 MG PO TABS
60.0000 mg | ORAL_TABLET | Freq: Four times a day (QID) | ORAL | Status: DC
  Administered 2021-02-28 – 2021-03-01 (×3): 60 mg via ORAL
  Filled 2021-02-28 (×2): qty 2

## 2021-02-28 MED ORDER — DILTIAZEM HCL 25 MG/5ML IV SOLN
10.0000 mg | Freq: Once | INTRAVENOUS | Status: DC
  Filled 2021-02-28: qty 5

## 2021-02-28 MED ORDER — PNEUMOCOCCAL VAC POLYVALENT 25 MCG/0.5ML IJ INJ
0.5000 mL | INJECTION | INTRAMUSCULAR | Status: AC
  Administered 2021-03-01: 0.5 mL via INTRAMUSCULAR
  Filled 2021-02-28: qty 0.5

## 2021-02-28 MED ORDER — DILTIAZEM HCL 25 MG/5ML IV SOLN: 10.0000 mg | Freq: Once | INTRAVENOUS | Status: DC

## 2021-02-28 MED ORDER — DILTIAZEM HCL 25 MG/5ML IV SOLN
15.0000 mg | Freq: Once | INTRAVENOUS | Status: DC
  Filled 2021-02-28: qty 5

## 2021-02-28 MED ORDER — DILTIAZEM HCL 25 MG/5ML IV SOLN
5.0000 mg | Freq: Once | INTRAVENOUS | Status: AC
  Administered 2021-02-28: 5 mg via INTRAVENOUS
  Filled 2021-02-28: qty 5

## 2021-02-28 MED ORDER — DILTIAZEM HCL 25 MG/5ML IV SOLN
10.0000 mg | Freq: Once | INTRAVENOUS | Status: AC
  Administered 2021-02-28: 10 mg via INTRAVENOUS
  Filled 2021-02-28 (×2): qty 5

## 2021-02-28 MED ORDER — IPRATROPIUM-ALBUTEROL 0.5-2.5 (3) MG/3ML IN SOLN
3.0000 mL | Freq: Four times a day (QID) | RESPIRATORY_TRACT | Status: DC
  Administered 2021-02-28 – 2021-03-01 (×4): 3 mL via RESPIRATORY_TRACT
  Filled 2021-02-28 (×4): qty 3

## 2021-02-28 MED ORDER — GADOBUTROL 1 MMOL/ML IV SOLN
6.0000 mL | Freq: Once | INTRAVENOUS | Status: AC | PRN
  Administered 2021-02-28: 6 mL via INTRAVENOUS

## 2021-02-28 MED ORDER — PREDNISONE 20 MG PO TABS
40.0000 mg | ORAL_TABLET | Freq: Every day | ORAL | Status: DC
  Administered 2021-02-28 – 2021-03-01 (×2): 40 mg via ORAL
  Filled 2021-02-28 (×2): qty 2

## 2021-02-28 MED ORDER — LORAZEPAM 1 MG PO TABS: 1.0000 mg | ORAL_TABLET | Freq: Three times a day (TID) | ORAL | Status: DC | PRN

## 2021-02-28 NOTE — Progress Notes (Signed)
Pt arrived to floor via bed at 1715 alert and oriented x 4. Pt complaining of 4/10 headache pain. HR 120. 10mg  IV scheduled cardizem administered. HR dropped to 103 temporarily but is now back up to 120. PRN 650mg  Tylenol administered for headache. Will continue to monitor.

## 2021-02-28 NOTE — Consult Note (Signed)
Pulmonary Critical Care  Initial Consult Note  Brittany Wheeler OZH:[id number] DOB: 08/30/1945 DOA: 02/27/2021  Referring physician: Dr. Francine Graven  Chief Complaint: Lung mass  HPI: Brittany Wheeler is a 75 y.o. female with a history of hyperlipidemia hypertension thyroid issues hypothyroidism came into the hospital because of increasing shortness of breath.  Apparently she had been previously diagnosed with a lung mass and was supposed to be seen by pulmonary but she came into the hospital with increasing shortness of breath.  She has had worsening of symptoms for at least 2 weeks.  Also weight loss and anorexia going on the last couple months.  In addition to its felt that she had COPD had been given some inhalers to use.  CT scan was done on the 22nd which showed a very large mass in the right lung.  I am asked to see the patient for possibility of bronchoscopy.  Patient has been on aspirin and has continue to take that on the day of admission.  Review of Systems:  Constitutional:  + weight loss, night sweats, Fevers, chills, +fatigue.  HEENT:  No headaches, nasal congestion, post nasal drip,  Cardio-vascular:  +chest pain, palpitations  GI:  No heartburn, indigestion, abdominal pain, nausea, vomiting, diarrhea  Resp:  +shortness of breath at rest. +productive cough +wheezing Skin:  no rash or lesions.  Musculoskeletal:  No joint pain or swelling.   Remainder ROS performed and is unremarkable other than noted in HPI  Past Medical History:  Diagnosis Date   Hyperlipidemia    Hypertension    Thyroid disease    Past Surgical History:  Procedure Laterality Date   THYROIDECTOMY     Social History:  reports that she has quit smoking. Her smoking use included cigarettes. She has never used smokeless tobacco. She reports previous alcohol use. No history on file for drug use.  Allergies  Allergen Reactions   Latex    Penicillins     Family History  Problem Relation Age of Onset    Mesothelioma Father    Rectal cancer Sister     Prior to Admission medications   Medication Sig Start Date End Date Taking? Authorizing Provider  aspirin 81 MG EC tablet Take 81 mg by mouth daily.   Yes [provider]  Budeson-Glycopyrrol-Formoterol (BREZTRI AEROSPHERE) 160-9-4.8 MCG/ACT AERO Inhale 1 spray into the lungs 2 (two) times daily.   Yes [provider]  fluticasone (FLONASE) 50 MCG/ACT nasal spray Place 1 spray into both nostrils in the morning and at bedtime. 02/26/17  Yes [provider]  hydrochlorothiazide (HYDRODIURIL) 12.5 MG tablet Take 12.5 mg by mouth daily. 02/18/21  Yes [provider]  ipratropium (ATROVENT) 0.06 % nasal spray Place 2 sprays into both nostrils 3 (three) times daily. 02/21/21  Yes [provider]  levocetirizine (XYZAL) 5 MG tablet Take 5 mg by mouth daily as needed. 02/14/21  Yes [provider]  levothyroxine (SYNTHROID) 112 MCG tablet Take 112 mcg by mouth daily. 02/09/21  Yes [provider]  Multiple Vitamin (MULTI-VITAMIN) tablet Take 1 tablet by mouth daily.   Yes [provider]  PARoxetine (PAXIL) 20 MG tablet Take 20 mg by mouth daily. 02/08/21  Yes [provider]   Physical Exam: Vitals:   02/28/21 0700 02/28/21 0800 02/28/21 0911 02/28/21 1119  BP: (!) 163/98 (!) 157/88 (!) 162/96 125/75  Pulse: 97 100 (!) 109 (!) 129  Resp: 16 17 (!) 21   Temp:    99.3  F (37.4 C)  TempSrc:      SpO2: 95% 96% 96% 97%  Weight:      Height:        Wt Readings from Last 3 Encounters:  02/27/21 68 kg    General:  Appears calm and comfortable Eyes: PERRL, normal lids, irises & conjunctiva ENT: grossly normal hearing, lips & tongue Neck: no LAD, masses or thyromegaly Cardiovascular: RRR, no m/r/g. No LE edema. Respiratory: CTA bilaterally, no w/r/r.       Normal respiratory effort. Abdomen: soft, nontender Skin: no rash or induration seen on limited exam Musculoskeletal:  grossly normal tone BUE/BLE Psychiatric: grossly normal mood and affect Neurologic: grossly non-focal.          Labs on Admission:  Basic Metabolic Panel: Recent Labs  Lab 02/27/21 1011 02/28/21 0546  NA 134* 134*  K 3.9 3.5  CL 101 99  CO2 25 27  GLUCOSE 127* 135*  BUN 17 15  CREATININE 0.87 0.82  CALCIUM 10.4* 10.3   Liver Function Tests: No results for input(s): AST, ALT, ALKPHOS, BILITOT, PROT, ALBUMIN in the last 168 hours. No results for input(s): LIPASE, AMYLASE in the last 168 hours. No results for input(s): AMMONIA in the last 168 hours. CBC: Recent Labs  Lab 02/27/21 1011 02/28/21 0546  WBC 8.8 9.9  HGB 12.0 11.6*  HCT 36.4 34.6*  MCV 89.0 89.4  PLT 406* 413*   Cardiac Enzymes: No results for input(s): CKTOTAL, CKMB, CKMBINDEX, TROPONINI in the last 168 hours.  BNP (last 3 results) Recent Labs    02/27/21 1011  BNP 52.0    ProBNP (last 3 results) No results for input(s): PROBNP in the last 8760 hours.  CBG: No results for input(s): GLUCAP in the last 168 hours.  Radiological Exams on Admission: CT Angio Chest PE W and/or Wo Contrast  Result Date: 02/27/2021 CLINICAL DATA:  Chest pain and shortness of breath, wheezing EXAM: CT ANGIOGRAPHY CHEST WITH CONTRAST TECHNIQUE: Multidetector CT imaging of the chest was performed using the standard protocol during bolus administration of intravenous contrast. Multiplanar CT image reconstructions and MIPs were obtained to evaluate the vascular anatomy. CONTRAST:  44mL OMNIPAQUE IOHEXOL 350 MG/ML SOLN COMPARISON:  None. FINDINGS: Cardiovascular: Thoracic aortic atherosclerosis without acute aneurysm or dissection. Patent 3 vessel arch anatomy. Pulmonary arteries appear patent without acute filling defect or pulmonary embolus. Segmental pulmonary branches to the right middle lobe are compressed by the right middle lobe mass described below. Normal heart size. Native coronary atherosclerosis noted. No pericardial  effusion. Mediastinum/Nodes: Bulky right paratracheal adenopathy measures 6 x 5.3 cm. Enlarged central left AP window lymph node measures 2.8 x 3.1 cm. Subcarinal adenopathy measures 3.2 x 2.9 cm. These are compatible with mediastinal metastatic adenopathy. Suspect additional ill-defined and difficult to measure right hilar adenopathy. Lungs/Pleura: Large central bulky right middle lobe mass measures approximately 7 x 4.7 x 6 cm. Mass does appear to invade into the right hilum with encasement of the central bronchial structures and abutting against the left atrium and right inferior pulmonary vein. 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 There is complete collapse or invasion of the right middle lobe bronchi. Bronchus intermedius also significantly encased and narrowed proximally. Central right lung surrounding interstitial micro nodularity suggesting right lung regional lymphangitic tumor spread in the right middle lobe and right upper lobe. Mild nonspecific right upper lobe patchy peribronchovascular airspace opacities. Left lung remains clear. Small non loculated right pleural effusion with associated right basilar subsegmental atelectasis.  Upper Abdomen: Enlarged upper abdomen gastric hepatic ligament lymph node measures 1.6 cm, image 82/4. No acute upper abdominal finding. Abdominal atherosclerosis noted. Musculoskeletal: Degenerative changes of the spine. No acute osseous finding. No osseous lytic or blastic disease. Sternum intact. Review of the MIP images confirms the above findings. IMPRESSION: Large bulky central right middle lobe mass measuring up to 7 cm compatible with a primary bronchogenic carcinoma with evidence of bulky metastatic mediastinal adenopathy and suspect surrounding right middle lobe and right upper lobe lymphangitic tumor spread. Also enlarged upper abdominal gastrohepatic lymph nodes suspicious for metastatic disease. Associated trace right pleural effusion and right  basilar atelectasis. Negative for significant acute pulmonary embolus. Aortic Atherosclerosis (ICD10-I70.0). These results were called by telephone at the time of interpretation on 02/27/2021 at 11:28 am to provider Dr. Alwyn Ren, who verbally acknowledged these results. Electronically Signed   By: Jerilynn Mages.  Shick M.D.   On: 02/27/2021 11:30    EKG: Independently reviewed.  Assessment/Plan Principal Problem:   Lung mass Active Problems:   Acute respiratory failure (HCC)   Hypertension   Thyroid disease   Depression   Lung mass right lung.  Patient has a rather large mass which is highly suspicious for malignancy.  Based on the patient's history and presentation she needs to have a diagnostic biopsy done.  While I felt that it would be easier to obtain via CT-guided needle biopsy interventional radiology feels to proceed with bronchoscopy first.  However because the patient has been on aspirin and a biopsy is anticipated that it would be increased risk of bleeding so therefore she needs to be off of the aspirin for at least 5 days prior to performing the procedure.  This would mean that the procedure can be done in an outpatient setting.  I have already gone ahead and tentatively scheduled her for next Monday.  Will discuss with the primary team COPD of unknown severity however patient had been given inhalers as an outpatient and this ought to be continued. Weight loss likely secondary to #1 in addition to that patient has had significant anorexia. Obstructive pneumonia there may also be a component of obstructive pneumonia and patient has been evaluated by the primary care team would consider antibiotic therapy in addition she may also benefit from steroids due to underlying COPD.  Code Status: Patient is DNR  Family Communication: No family present Disposition Plan: Home  Time spent: 70 minutes  I have personally obtained a history, examined the patient, evaluated laboratory and imaging  results, formulated the assessment and plan and placed orders.  The Patient requires high complexity decision making for assessment and support. Total Time Spent 22min   Monserat Prestigiacomo A Estrellita Lasky, MD [facility] Pulmonary Critical Care Medicine Sleep Medicine

## 2021-02-28 NOTE — TOC Progression Note (Signed)
Transition of Care [facility]) - Progression Note    Patient Details  Name: Brittany Wheeler MRN: [id number] Date of Birth: Mar 19, 1946  [facility]) CM/SW [place], RN Phone Number: 02/28/2021, 2:11 PM  Clinical Narrative:   Patient lives at home alone, neighbor at bedside.  Patient states the only assistance she has at home is from her neighbors.  She does not currently have home health, but she requests Home Health on discharge.  Neighbors drive patient to her appointments and pick up medication.  Patient has no concerns about taking medications as directed.  Patient does not have oxygen at home, but would like [facility] to provide home oxygen if needed.  Patient is transferring to ICU due to high heart rate and breathing with new onset of Atrial Fibrillation.          Expected Discharge Plan and Services                                                 Social Determinants of Health (SDOH) Interventions    Readmission Risk Interventions No flowsheet data found.

## 2021-02-28 NOTE — Progress Notes (Signed)
PROGRESS NOTE    Brittany Wheeler  PRF:[id number] DOB: Nov 21, 1945 DOA: 02/27/2021 PCP: [name], MD  [id number]   Assessment & Plan:   Principal Problem:   Lung mass Active Problems:   Acute respiratory failure ([place])   Hypertension   Thyroid disease   Depression   Brittany Wheeler is a 75 y.o. female with medical history significant for hypothyroidism, hypertension, dyslipidemia and depression who presents to the emergency room for evaluation of worsening shortness of breath with minimal exertion. Patient states that she has had symptoms for over 2 weeks and initially was short of breath with moderate exertion but now even at rest she is short of breath.  Shortness of breath is associated with a cough productive of yellow phlegm.    Patient had an outpatient chest x-ray done due to persistence of her symptoms and it showed a right lung mass.  She had been scheduled for outpatient bronch for biopsy.   Dyspnea 2/2 Lung mass --not hypoxic. 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 --No signs of infection. Plan: --pulm Dr. Humphrey Rolls consulted, due to pt being on ASA, can not perform tissue biopsy now.  Will perform bronch on her previously scheduled time on 8/1. 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 --recently given inhaler as outpatient Plan: --start steroid per pulm rec, with prednisone 40 mg daily --DuoNeb scheduled --cont azithromycin   # Afib w RVR --new prior hx of known Afib, per pt --HR remained elevated despite IV dilt Plan: --transfer to PCU --start oral dilt 60 mg q6h --will not start anticoagulation since pt needs to be off blood thinner prior to biopsy   HTN --Hold home HCTZ  --start dilt for Afib RVR  Depression Continue Paxil   Anxiety --Ativan 1mg  TID PRN   Hypothyroidism Continue  Synthroid    DVT prophylaxis: SCD/Compression stockings Code Status: DNR  Family Communication: neighbor (who just became her POA) updated at bedside today Level of care: Progressive Cardiac Dispo:   The patient is from: home Anticipated d/c is to: home Anticipated d/c date is: 1-2 days Patient currently is not medically ready to d/c due to: Afib w RVR   Subjective and Interval History:  Pt reported feeling anxious.  RN walked pt and pt was able to maintain O2 sats, though HR went up significantly.  Pt was found to be in Afib RVR.  HR not controlled after IV dilt, so pt transferred to PCU.   Objective: Vitals:   02/28/21 1947 02/28/21 2134 03/01/21 0025 03/01/21 0530  BP: (!) 155/85  140/88 (!) 144/92  Pulse: (!) 110  (!) 108 100  Resp: 18  20 18   Temp: 98.6 F (37 C)  98.2 F (36.8 C) 98.1 F (36.7 C)  TempSrc:   Oral   SpO2: 96% 94% 98% 99%  Weight:      Height:        Intake/Output Summary (Last 24 hours) at 03/01/2021 0551 Last data filed at 02/28/2021 2217 Gross per 24 hour  Intake 370 ml  Output 300 ml  Net 70 ml   Filed Weights   02/27/21 0946 02/28/21 1407  Weight: 68 kg 68 kg    Examination:   Constitutional: NAD, AAOx3 HEENT: conjunctivae and lids normal, EOMI CV: irregular, No cyanosis.   RESP: normal respiratory effort, on RA Extremities: No effusions, edema  in BLE SKIN: warm, dry Neuro: II - XII grossly intact.   Psych: Normal mood and affect.  Appropriate judgement and reason   Data Reviewed: I have personally reviewed following labs and imaging studies  CBC: Recent Labs  Lab 02/27/21 1011 02/28/21 0546  WBC 8.8 9.9  HGB 12.0 11.6*  HCT 36.4 34.6*  MCV 89.0 89.4  PLT 406* 270*   Basic Metabolic Panel: Recent Labs  Lab 02/27/21 1011 02/28/21 0546  NA 134* 134*  K 3.9 3.5  CL 101 99  CO2 25 27  GLUCOSE 127* 135*  BUN 17 15  CREATININE 0.87 0.82  CALCIUM 10.4* 10.3   GFR: Estimated Creatinine Clearance: 53.9 mL/min (by  C-G formula based on SCr of 0.82 mg/dL). Liver Function Tests: No results for input(s): AST, ALT, ALKPHOS, BILITOT, PROT, ALBUMIN in the last 168 hours. No results for input(s): LIPASE, AMYLASE in the last 168 hours. No results for input(s): AMMONIA in the last 168 hours. Coagulation Profile: Recent Labs  Lab 02/27/21 1011  INR 1.0   Cardiac Enzymes: No results for input(s): CKTOTAL, CKMB, CKMBINDEX, TROPONINI in the last 168 hours. BNP (last 3 results) No results for input(s): PROBNP in the last 8760 hours. HbA1C: No results for input(s): HGBA1C in the last 72 hours. CBG: No results for input(s): GLUCAP in the last 168 hours. Lipid Profile: No results for input(s): CHOL, HDL, LDLCALC, TRIG, CHOLHDL, LDLDIRECT in the last 72 hours. Thyroid Function Tests: No results for input(s): TSH, T4TOTAL, FREET4, T3FREE, THYROIDAB in the last 72 hours. Anemia Panel: No results for input(s): VITAMINB12, FOLATE, FERRITIN, TIBC, IRON, RETICCTPCT in the last 72 hours. Sepsis Labs: Recent Labs  Lab 02/27/21 1011  PROCALCITON <0.10    Recent Results (from the past 240 hour(s))  Resp Panel by RT-PCR (Flu A&B, Covid) Nasopharyngeal Swab     Status: None   Collection Time: 02/27/21  2:10 PM   Specimen: Nasopharyngeal Swab; Nasopharyngeal(NP) swabs in vial transport medium  Result Value Ref Range Status   SARS Coronavirus 2 by RT PCR NEGATIVE NEGATIVE Final    Comment: (NOTE) SARS-CoV-2 target nucleic acids are NOT DETECTED.  The SARS-CoV-2 RNA is generally detectable in upper respiratory specimens during the acute phase of infection. The lowest concentration of SARS-CoV-2 viral copies this assay can detect is 138 copies/mL. A negative result does not preclude SARS-Cov-2 infection and should not be used as the sole basis for treatment or other patient management decisions. A negative result may occur with  improper specimen collection/handling, submission of specimen other than  nasopharyngeal swab, presence of viral mutation(s) within the areas targeted by this assay, and inadequate number of viral copies(<138 copies/mL). A negative result must be combined with clinical observations, patient history, and epidemiological information. The expected result is Negative.  Fact Sheet for Patients:  EntrepreneurPulse.com.au  Fact Sheet for Healthcare Providers:  IncredibleEmployment.be  This test is no t yet approved or cleared by the [place] FDA and  has been authorized for detection and/or diagnosis of SARS-CoV-2 by FDA under an Emergency Use Authorization (EUA). This EUA will remain  in effect (meaning this test can be used) for the duration of the COVID-19 declaration under Section 564(b)(1) of the Act, 21 U.S.C.section 360bbb-3(b)(1), unless the authorization is terminated  or revoked sooner.       Influenza A by PCR NEGATIVE NEGATIVE Final   Influenza B by PCR NEGATIVE NEGATIVE Final    Comment: (NOTE) The Xpert Xpress SARS-CoV-2/FLU/RSV plus assay is intended  as an aid in the diagnosis of influenza from Nasopharyngeal swab specimens and should not be used as a sole basis for treatment. Nasal washings and aspirates are unacceptable for Xpert Xpress SARS-CoV-2/FLU/RSV testing.  Fact Sheet for Patients: EntrepreneurPulse.com.au  Fact Sheet for Healthcare Providers: IncredibleEmployment.be  This test is not yet approved or cleared by the [place] FDA and has been authorized for detection and/or diagnosis of SARS-CoV-2 by FDA under an Emergency Use Authorization (EUA). This EUA will remain in effect (meaning this test can be used) for the duration of the COVID-19 declaration under Section 564(b)(1) of the Act, 21 U.S.C. section 360bbb-3(b)(1), unless the authorization is terminated or revoked.  Performed at [facility], [address] [place], [place]  [address]       Radiology Studies: CT Angio Chest PE W and/or Wo Contrast  Result Date: 02/27/2021 CLINICAL DATA:  Chest pain and shortness of breath, wheezing EXAM: CT ANGIOGRAPHY CHEST WITH CONTRAST TECHNIQUE: Multidetector CT imaging of the chest was performed using the standard protocol during bolus administration of intravenous contrast. Multiplanar CT image reconstructions and MIPs were obtained to evaluate the vascular anatomy. CONTRAST:  72mL OMNIPAQUE IOHEXOL 350 MG/ML SOLN COMPARISON:  None. FINDINGS: Cardiovascular: Thoracic aortic atherosclerosis without acute aneurysm or dissection. Patent 3 vessel arch anatomy. Pulmonary arteries appear patent without acute filling defect or pulmonary embolus. Segmental pulmonary branches to the right middle lobe are compressed by the right middle lobe mass described below. Normal heart size. Native coronary atherosclerosis noted. No pericardial effusion. Mediastinum/Nodes: Bulky right paratracheal adenopathy measures 6 x 5.3 cm. Enlarged central left AP window lymph node measures 2.8 x 3.1 cm. Subcarinal adenopathy measures 3.2 x 2.9 cm. These are compatible with mediastinal metastatic adenopathy. Suspect additional ill-defined and difficult to measure right hilar adenopathy. Lungs/Pleura: Large central bulky right middle lobe mass measures approximately 7 x 4.7 x 6 cm. Mass does appear to invade into the right hilum with encasement of the central bronchial structures and abutting against the left atrium and right inferior pulmonary vein. 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 There is complete collapse or invasion of the right middle lobe bronchi. Bronchus intermedius also significantly encased and narrowed proximally. Central right lung surrounding interstitial micro nodularity suggesting right lung regional lymphangitic tumor spread in the right middle lobe and right upper lobe. Mild nonspecific right upper lobe patchy peribronchovascular  airspace opacities. Left lung remains clear. Small non loculated right pleural effusion with associated right basilar subsegmental atelectasis. Upper Abdomen: Enlarged upper abdomen gastric hepatic ligament lymph node measures 1.6 cm, image 82/4. No acute upper abdominal finding. Abdominal atherosclerosis noted. Musculoskeletal: Degenerative changes of the spine. No acute osseous finding. No osseous lytic or blastic disease. Sternum intact. Review of the MIP images confirms the above findings. IMPRESSION: Large bulky central right middle lobe mass measuring up to 7 cm compatible with a primary bronchogenic carcinoma with evidence of bulky metastatic mediastinal adenopathy and suspect surrounding right middle lobe and right upper lobe lymphangitic tumor spread. Also enlarged upper abdominal gastrohepatic lymph nodes suspicious for metastatic disease. Associated trace right pleural effusion and right basilar atelectasis. Negative for significant acute pulmonary embolus. Aortic Atherosclerosis (ICD10-I70.0). These results were called by telephone at the time of interpretation on 02/27/2021 at 11:28 am to provider Dr. Alwyn Ren, who verbally acknowledged these results. Electronically Signed   By: Jerilynn Mages.  Shick M.D.   On: 02/27/2021 11:30   MR BRAIN W WO CONTRAST  Result Date: 02/28/2021 CLINICAL DATA:  Lung cancer, staging EXAM: MRI HEAD WITHOUT AND WITH CONTRAST TECHNIQUE: Multiplanar, multiecho pulse sequences of the brain and surrounding structures were obtained without and with intravenous contrast. CONTRAST:  77mL GADAVIST GADOBUTROL 1 MMOL/ML IV SOLN COMPARISON:  None. FINDINGS: Brain: There is no acute infarction or intracranial hemorrhage. There is no intracranial mass, mass effect, or edema. There is no hydrocephalus or extra-axial fluid collection. Ventricles and sulci are within normal limits in size and configuration. Patchy and confluent areas of T2 hyperintensity in the supratentorial and pontine white  matter are nonspecific may reflect mild to moderate chronic microvascular ischemic changes. No abnormal enhancement. Vascular: Major vessel flow voids at the skull base are preserved. Skull and upper cervical spine: Normal marrow signal is preserved. Sinuses/Orbits: Paranasal sinuses are aerated. Orbits are unremarkable. Other: Sella is unremarkable.  Mastoid air cells are clear. IMPRESSION: No evidence of intracranial metastatic disease. Chronic microvascular ischemic changes. Electronically Signed   By: Macy Mis M.D.   On: 02/28/2021 20:57     Scheduled Meds:  diltiazem  60 mg Oral Q6H   fluticasone  1 spray Each Nare Daily   hydrochlorothiazide  12.5 mg Oral Daily   ipratropium-albuterol  3 mL Nebulization Q6H WA   levothyroxine  112 mcg Oral Q0600   multivitamin with minerals  1 tablet Oral Daily   PARoxetine  20 mg Oral Daily   pneumococcal 23 valent vaccine  0.5 mL Intramuscular Tomorrow-1000   predniSONE  40 mg Oral Q breakfast   Continuous Infusions:  azithromycin Stopped (02/28/21 2217)     LOS: 2 days     Enzo Bi, MD [facility] If 7PM-7AM, please contact night-coverage 03/01/2021, 5:51 AM

## 2021-02-28 NOTE — ED Notes (Signed)
Assisted pt to the bedside commode.

## 2021-02-28 NOTE — ED Notes (Signed)
Care transferred, report received from [place], [place]

## 2021-02-28 NOTE — ED Provider Notes (Signed)
[facility] Emergency Department Provider Note   ____________________________________________   Event Date/Time   First MD Initiated Contact with Patient 02/28/21 [phone]     (approximate)  I have reviewed the triage vital signs and the nursing notes.   HISTORY  Chief Complaint Shortness of Breath    HPI Brittany Wheeler is a 75 y.o. female presents for increased shortness of breath  LOCATION: Chest DURATION: 2 weeks TIMING: Worsening since onset SEVERITY: Severe QUALITY: Shortness of breath CONTEXT: Recently diagnosed with a lung mass on x-ray but has not followed up with heme-onc or received any further scans MODIFYING FACTORS: Exertion worsens the shortness of breath and is partially relieved at rest ASSOCIATED SYMPTOMS: Nonproductive cough   Per medical record review, patient recently diagnosed with a left lung mass on x-ray          Past Medical History:  Diagnosis Date   Hyperlipidemia    Hypertension    Thyroid disease     Patient Active Problem List   Diagnosis Date Noted   Acute respiratory failure ([place]) 02/27/2021   Lung mass 02/27/2021   Hypertension    Thyroid disease    Depression     Past Surgical History:  Procedure Laterality Date   THYROIDECTOMY      Prior to Admission medications   Medication Sig Start Date End Date Taking? Authorizing Provider  fluticasone (FLONASE) 50 MCG/ACT nasal spray Place 1 spray into both nostrils in the morning and at bedtime. 02/26/17  Yes [provider]  hydrochlorothiazide (HYDRODIURIL) 12.5 MG tablet Take 12.5 mg by mouth daily. 02/18/21  Yes [provider]  ipratropium (ATROVENT) 0.06 % nasal spray Place 2 sprays into both nostrils 3 (three) times daily. 02/21/21  Yes [provider]  levocetirizine (XYZAL) 5 MG tablet Take 5 mg by mouth daily as needed. 02/14/21  Yes [provider]  levothyroxine (SYNTHROID) 112 MCG tablet Take 112 mcg by mouth  daily. 02/09/21  Yes [provider]  Multiple Vitamin (MULTI-VITAMIN) tablet Take 1 tablet by mouth daily.   Yes [provider]  PARoxetine (PAXIL) 20 MG tablet Take 20 mg by mouth daily. 02/08/21  Yes [provider]  aspirin 81 MG EC tablet Hold until after your biopsy and after Dr. Humphrey Rolls says ok to resume. 03/01/21   Enzo Bi, MD  azithromycin (ZITHROMAX) 500 MG tablet Take 1 tablet (500 mg total) by mouth daily for 2 days. 03/02/21 03/04/21  Enzo Bi, MD  Budeson-Glycopyrrol-Formoterol (BREZTRI AEROSPHERE) 160-9-4.8 MCG/ACT AERO Inhale 1 spray into the lungs 2 (two) times daily. 03/01/21 05/30/21  Enzo Bi, MD  diltiazem (CARDIZEM CD) 240 MG 24 hr capsule Take 1 capsule (240 mg total) by mouth daily. To control your heart rate from Afib. 03/01/21 05/30/21  Enzo Bi, MD  guaiFENesin-dextromethorphan (ROBITUSSIN DM) 100-10 MG/5ML syrup Take 10 mLs by mouth every 6 (six) hours as needed for cough. 03/01/21   Enzo Bi, MD  ipratropium-albuterol (DUONEB) 0.5-2.5 (3) MG/3ML SOLN Take 3 mLs by nebulization every 6 (six) hours as needed. 03/01/21   Enzo Bi, MD  predniSONE (DELTASONE) 20 MG tablet Take 2 tablets (40 mg total) by mouth daily with breakfast for 2 days. 03/02/21 03/04/21  Enzo Bi, MD    Allergies Latex and Penicillins  Family History  Problem Relation Age of Onset   Mesothelioma Father    Rectal cancer Sister     Social History Social History   Tobacco Use   Smoking status:  Former    Types: Cigarettes   Smokeless tobacco: Never  Substance Use Topics   Alcohol use: Not Currently    Review of Systems Constitutional: No fever/chills Eyes: No visual changes. ENT: No sore throat. Cardiovascular: Denies chest pain. Respiratory: Endorses shortness of breath. Gastrointestinal: No abdominal pain.  No nausea, no vomiting.  No diarrhea. Genitourinary: Negative for dysuria. Musculoskeletal: Negative for acute arthralgias Skin: Negative for  rash. Neurological: Negative for headaches, weakness/numbness/paresthesias in any extremity Psychiatric: Negative for suicidal ideation/homicidal ideation   ____________________________________________   PHYSICAL EXAM:  VITAL SIGNS: ED Triage Vitals  Enc Vitals Group     BP 02/27/21 0945 (!) 174/125     Pulse Rate 02/27/21 0945 (!) 120     Resp 02/27/21 0945 (!) 24     Temp 02/27/21 0945 97.6 F (36.4 C)     Temp Source 02/27/21 0945 Oral     SpO2 02/27/21 0945 94 %     Weight 02/27/21 0946 150 lb (68 kg)     Height 02/27/21 0946 5\' 1"  (1.549 m)     Head Circumference --      Peak Flow --      Pain Score 02/27/21 0946 0     Pain Loc --      Pain Edu? --      Excl. in [place]? --    Constitutional: Alert and oriented. Well appearing and in no acute distress. Eyes: Conjunctivae are normal. PERRL. Head: Atraumatic. Nose: No congestion/rhinnorhea. Mouth/Throat: Mucous membranes are moist. Neck: No stridor Cardiovascular: Grossly normal heart sounds.  Good peripheral circulation. Respiratory: Coarse breath sounds over left upper and lower lung fields.  Normal respiratory effort.  No retractions. Gastrointestinal: Soft and nontender. No distention. Musculoskeletal: No obvious deformities Neurologic:  Normal speech and language. No gross focal neurologic deficits are appreciated. Skin:  Skin is warm and dry. No rash noted. Psychiatric: Mood and affect are normal. Speech and behavior are normal.  ____________________________________________   LABS (all labs ordered are listed, but only abnormal results are displayed)  Labs Reviewed  CBC - Abnormal; Notable for the following components:      Result Value   Platelets 406 (*)    All other components within normal limits  BASIC METABOLIC PANEL - Abnormal; Notable for the following components:   Sodium 134 (*)    Glucose, Bld 127 (*)    Calcium 10.4 (*)    All other components within normal limits  BASIC METABOLIC PANEL -  Abnormal; Notable for the following components:   Sodium 134 (*)    Glucose, Bld 135 (*)    All other components within normal limits  CBC - Abnormal; Notable for the following components:   Hemoglobin 11.6 (*)    HCT 34.6 (*)    Platelets 413 (*)    All other components within normal limits  BASIC METABOLIC PANEL - Abnormal; Notable for the following components:   Sodium 130 (*)    Chloride 93 (*)    Glucose, Bld 175 (*)    Calcium 10.5 (*)    GFR, Estimated 59 (*)    All other components within normal limits  CBC - Abnormal; Notable for the following components:   RBC 3.83 (*)    Hemoglobin 11.3 (*)    HCT 34.4 (*)    Platelets 442 (*)    All other components within normal limits  RESP PANEL BY RT-PCR (FLU A&B, COVID) ARPGX2  BRAIN NATRIURETIC PEPTIDE  PROTIME-INR  APTT  PROCALCITONIN  MAGNESIUM  TROPONIN I (HIGH SENSITIVITY)   ____________________________________________  EKG  ED ECG REPORT I, Naaman Plummer, the attending physician, personally viewed and interpreted this ECG.  Date: 02/28/2021 EKG Time: 1737 Rate: 134 Rhythm: normal sinus rhythm QRS Axis: normal Intervals: normal ST/T Wave abnormalities: normal Narrative Interpretation: no evidence of acute ischemia  ____________________________________________  RADIOLOGY  ED MD interpretation: CT angiography of the chest shows no evidence of pulmonary emboli but does show a large left-sided mass with mass-effect on multiple bronchi  Official radiology report(s): No results found.  ____________________________________________   PROCEDURES  Procedure(s) performed (including Critical Care):  .1-3 Lead EKG Interpretation  Date/Time: 03/02/2021 11:20 PM Performed by: Naaman Plummer, MD Authorized by: Naaman Plummer, MD     Interpretation: abnormal     ECG rate:  114   ECG rate assessment: tachycardic     Rhythm: atrial fibrillation     Ectopy: none     Conduction: normal      ____________________________________________   INITIAL IMPRESSION / ASSESSMENT AND PLAN / ED COURSE  As part of my medical decision making, I reviewed the following data within the electronic medical record, if available:  Nursing notes reviewed and incorporated, Labs reviewed, EKG interpreted, Old chart reviewed, Radiograph reviewed and Notes from prior ED visits reviewed and incorporated        Patient presents for worsening shortness of breath over the last 2 weeks in the setting of newly found lung mass on the left.  Differential diagnosis includes but is not limited to physical collapse of bronchi secondary to mass-effect, PE, malignant effusion, pneumonia, sepsis, ACS  CT of the chest shows the large lung mass with lymphadenopathy and mass-effect on bronchi.  These findings are likely cause of patient's persistent shortness of breath  Patient tachypneic without evidence of hypoxia however air hunger is relieved with nasal cannula  Given the symptoms and new findings on chest CT, patient will require admission to the internal medicine service for further evaluation and management  Dispo: Admit to medicine      ____________________________________________   FINAL CLINICAL IMPRESSION(S) / ED DIAGNOSES  Final diagnoses:  SOB (shortness of breath)     ED Discharge Orders          Ordered    azithromycin (ZITHROMAX) 500 MG tablet  Daily        03/01/21 1000    predniSONE (DELTASONE) 20 MG tablet  Daily with breakfast        03/01/21 1000    aspirin 81 MG EC tablet        03/01/21 1000    pneumococcal 23 valent vaccine (PNEUMOVAX-23) 25 MCG/0.5ML injection  Tomorrow-1000        03/01/21 1000    diltiazem (CARDIZEM CD) 240 MG 24 hr capsule  Daily        03/01/21 1000    guaiFENesin-dextromethorphan (ROBITUSSIN DM) 100-10 MG/5ML syrup  Every 6 hours PRN        03/01/21 1000    Discharge instructions  Status:  Canceled       Comments: Please go to your scheduled  outpatient bronchoscopy with biopsy on 03/06/21 with Dr. Humphrey Rolls.  Please finish 2 more days of azithromycin and prednisone starting tomorrow 7/28 for your COPD flare up.  You went into Afib with high heart rate.  I am starting you on a new medication Cardizem to control your heart rate.  You are not started on blood thinner for stroke prevention  due to your upcoming biopsy.  Please follow up with your PCP for further management of your Afib, or have PCP refer you to a cardiologist.   Dr. Enzo Bi - -   03/01/21 1000    Budeson-Glycopyrrol-Formoterol (BREZTRI AEROSPHERE) 160-9-4.8 MCG/ACT AERO  2 times daily        03/01/21 1048    ipratropium-albuterol (DUONEB) 0.5-2.5 (3) MG/3ML SOLN  Every 6 hours PRN       Note to Pharmacy: Can give equivalent if covered by insurance.   03/01/21 1048    Discharge instructions       Comments: Please go to your scheduled outpatient bronchoscopy with biopsy on 03/06/21 with Dr. Humphrey Rolls.  Please finish 2 more days of azithromycin and prednisone starting tomorrow 7/28 for your COPD flare up.  You went into Afib with high heart rate.  I am starting you on a new medication Cardizem to control your heart rate.  You are not started on blood thinner for stroke prevention due to your upcoming biopsy.  Please follow up with your PCP for further management of your Afib, or have PCP refer you to a cardiologist.  Your oxygen saturation level didn't drop with walking, but we are sending you home with oxygen to relieve your symptoms of shortness of breath, since you do have lung cancer.   Dr. Enzo Bi - -   03/01/21 1050             Note:  This document was prepared using Dragon voice recognition software and may include unintentional dictation errors.    Naaman Plummer, MD 03/02/21 [phone]

## 2021-02-28 NOTE — Progress Notes (Signed)
Saw patient as rapid response RN per request from 1A staff due to afib RVR with rates in the 150s. Prior to rapid response RN arrival, patient had already been seen by Dr. Billie Ruddy and Stanton Kidney RN had already given the patient 5 mg of cardizem IV push (see eMAR). Patient's heart rate upon arrival was intially 120-130s afib but during encounter converted to ST in 110s. Patient reported no symptoms while she was resting in bed and only had shortness of breath while she was moving. BP just before rapid response RN arrived was 122/66. Patient with pending orders to transfer to PCU. Cardiac monitoring team to inform 1A staff if patient's heart rate converts back to afib or goes above 130.

## 2021-02-28 NOTE — Progress Notes (Signed)
   02/28/21 1229  Assess: MEWS Score  Temp 98.2 F (36.8 C)  BP 102/73  Pulse Rate (!) 131  Resp 16  SpO2 97 %  O2 Device Nasal Cannula  Assess: MEWS Score  MEWS Temp 0  MEWS Systolic 0  MEWS Pulse 3  MEWS RR 0  MEWS LOC 0  MEWS Score 3  MEWS Score Color Yellow  Assess: if the MEWS score is Yellow or Red  Were vital signs taken at a resting state? Yes  Focused Assessment Change from prior assessment (see assessment flowsheet)  Does the patient meet 2 or more of the SIRS criteria? No  MEWS guidelines implemented *See Row Information* Yes  Treat  MEWS Interventions Escalated (See documentation below)  Pain Scale 0-10  Pain Score 0  Take Vital Signs  Increase Vital Sign Frequency  Yellow: Q 2hr X 2 then Q 4hr X 2, if remains yellow, continue Q 4hrs  Escalate  MEWS: Escalate Yellow: discuss with charge nurse/RN and consider discussing with provider and RRT  Notify: Charge Nurse/RN  Name of Charge Nurse/RN Notified Wilder Glade  Date Charge Nurse/RN Notified 02/28/21  Time Charge Nurse/RN Notified 1093  Notify: Provider  Provider Name/Title Cindie Laroche  Date Provider Notified 02/28/21  Time Provider Notified 1212  Notification Type Page (secure chat)  Notification Reason Change in status  Provider response See new orders  Date of Provider Response 02/28/21  Time of Provider Response 1215  Document  Patient Outcome Not stable and remains on department  Progress note created (see row info) Yes  Assess: SIRS CRITERIA  SIRS Temperature  0  SIRS Pulse 1  SIRS Respirations  0  SIRS WBC 0  SIRS Score Sum  1

## 2021-02-28 NOTE — Consult Note (Signed)
[facility]  [phone] [phone]) [phone]  ID: Brittany Wheeler OB: Jun 21, 1946  MR#: [id number]  KDT#:[id number]  Patient Care Team: [name], MD as PCP - General (Family Medicine)  CHIEF COMPLAINT: Right middle lobe lung mass.  INTERVAL HISTORY: Patient is a 75 year old female who presented to the emergency room with worsening shortness of breath and dyspnea on exertion associated with cough over the last 1 to 2 weeks.  Subsequent CT scan revealed a large right middle lobe mass with mediastinal lymphadenopathy highly suspicious for underlying malignancy.  She continues to have shortness of breath and chest pain, but is also in rapid A. fib.  She has no neurologic complaints.  She denies any recent fevers or illnesses.  She has a good appetite and denies weight loss.  She has no neurologic complaints.  She denies any hemoptysis.  She has no nausea, vomiting, constipation, or diarrhea.  She has no melena or hematochezia.  She has no urinary complaints.  Patient offers no further specific complaints today.  REVIEW OF SYSTEMS:   Review of Systems  Constitutional: Negative.  Negative for fever, malaise/fatigue and weight loss.  Respiratory:  Positive for cough and shortness of breath. Negative for hemoptysis.   Cardiovascular:  Positive for chest pain and palpitations. Negative for leg swelling.  Gastrointestinal: Negative.  Negative for abdominal pain.  Genitourinary: Negative.  Negative for dysuria.  Musculoskeletal: Negative.  Negative for back pain.  Skin: Negative.  Negative for rash.  Neurological: Negative.  Negative for dizziness, focal weakness, weakness and headaches.  Psychiatric/Behavioral: Negative.  The patient is not nervous/anxious.    As per HPI. Otherwise, a complete review of systems is negative.  PAST MEDICAL HISTORY: Past Medical History:  Diagnosis Date   Hyperlipidemia    Hypertension    Thyroid disease     PAST SURGICAL  HISTORY: Past Surgical History:  Procedure Laterality Date   THYROIDECTOMY      FAMILY HISTORY: Family History  Problem Relation Age of Onset   Mesothelioma Father    Rectal cancer Sister     ADVANCED DIRECTIVES (Y/N):  @ADVDIR @  HEALTH MAINTENANCE: Social History   Tobacco Use   Smoking status: Former    Types: Cigarettes   Smokeless tobacco: Never  Substance Use Topics   Alcohol use: Not Currently     Colonoscopy:  PAP:  Bone density:  Lipid panel:  Allergies  Allergen Reactions   Latex    Penicillins     Current Facility-Administered Medications  Medication Dose Route Frequency Provider Last Rate Last Admin   acetaminophen (TYLENOL) tablet 650 mg  650 mg Oral Q6H PRN Agbata, Tochukwu, MD       Or   acetaminophen (TYLENOL) suppository 650 mg  650 mg Rectal Q6H PRN Agbata, Tochukwu, MD       azithromycin (ZITHROMAX) 500 mg in sodium chloride 0.9 % 250 mL IVPB  500 mg Intravenous Q24H Agbata, Tochukwu, MD   Stopped at 02/27/21 1629   cetirizine (ZYRTEC) tablet 10 mg  10 mg Oral Daily PRN Agbata, Tochukwu, MD       diltiazem (CARDIZEM) injection 10 mg  10 mg Intravenous Once Enzo Bi, MD       fluticasone (FLONASE) 50 MCG/ACT nasal spray 1 spray  1 spray Each Nare Daily Agbata, Tochukwu, MD   1 spray at 02/28/21 0916   hydrALAZINE (APRESOLINE) injection 10 mg  10 mg Intravenous Q6H PRN Agbata, Tochukwu, MD   10 mg at 02/28/21 1056  hydrochlorothiazide (HYDRODIURIL) tablet 12.5 mg  12.5 mg Oral Daily Agbata, Tochukwu, MD   12.5 mg at 02/28/21 0912   ipratropium-albuterol (DUONEB) 0.5-2.5 (3) MG/3ML nebulizer solution 3 mL  3 mL Nebulization Q6H PRN Agbata, Tochukwu, MD       ipratropium-albuterol (DUONEB) 0.5-2.5 (3) MG/3ML nebulizer solution 3 mL  3 mL Nebulization Q6H WA Enzo Bi, MD   3 mL at 02/28/21 1540   levothyroxine (SYNTHROID) tablet 112 mcg  112 mcg Oral Q0600 Agbata, Tochukwu, MD   112 mcg at 02/28/21 0911   LORazepam (ATIVAN) tablet 1 mg  1 mg Oral TID  PRN Enzo Bi, MD       multivitamin with minerals tablet 1 tablet  1 tablet Oral Daily Agbata, Tochukwu, MD   1 tablet at 02/28/21 0911   ondansetron (ZOFRAN) tablet 4 mg  4 mg Oral Q6H PRN Agbata, Tochukwu, MD       Or   ondansetron (ZOFRAN) injection 4 mg  4 mg Intravenous Q6H PRN Agbata, Tochukwu, MD       PARoxetine (PAXIL) tablet 20 mg  20 mg Oral Daily Agbata, Tochukwu, MD   20 mg at 02/28/21 0913   [START ON 03/01/2021] pneumococcal 23 valent vaccine (PNEUMOVAX-23) injection 0.5 mL  0.5 mL Intramuscular Tomorrow-1000 Enzo Bi, MD       predniSONE (DELTASONE) tablet 40 mg  40 mg Oral Q breakfast Enzo Bi, MD        OBJECTIVE: Vitals:   02/28/21 1540 02/28/21 1607  BP:  129/79  Pulse: (!) 114 (!) 110  Resp: 20 16  Temp:  98.2 F (36.8 C)  SpO2: 97% 97%     Body mass index is 28.33 kg/m.    ECOG FS:1 - Symptomatic but completely ambulatory  General: Well-developed, well-nourished, no acute distress. Eyes: Pink conjunctiva, anicteric sclera. HEENT: Normocephalic, moist mucous membranes. Lungs: No audible wheezing or coughing. Heart: Irregular. Abdomen: Soft, nontender, no obvious distention. Musculoskeletal: No edema, cyanosis, or clubbing. Neuro: Alert, answering all questions appropriately. Cranial nerves grossly intact. Skin: No rashes or petechiae noted. Psych: Normal affect. Lymphatics: No cervical, calvicular, axillary or inguinal LAD.   LAB RESULTS:  Lab Results  Component Value Date   NA 134 (L) 02/28/2021   K 3.5 02/28/2021   CL 99 02/28/2021   CO2 27 02/28/2021   GLUCOSE 135 (H) 02/28/2021   BUN 15 02/28/2021   CREATININE 0.82 02/28/2021   CALCIUM 10.3 02/28/2021   GFRNONAA >60 02/28/2021    Lab Results  Component Value Date   WBC 9.9 02/28/2021   HGB 11.6 (L) 02/28/2021   HCT 34.6 (L) 02/28/2021   MCV 89.4 02/28/2021   PLT 413 (H) 02/28/2021     STUDIES: CT Angio Chest PE W and/or Wo Contrast  Result Date: 02/27/2021 CLINICAL DATA:  Chest  pain and shortness of breath, wheezing EXAM: CT ANGIOGRAPHY CHEST WITH CONTRAST TECHNIQUE: Multidetector CT imaging of the chest was performed using the standard protocol during bolus administration of intravenous contrast. Multiplanar CT image reconstructions and MIPs were obtained to evaluate the vascular anatomy. CONTRAST:  68mL OMNIPAQUE IOHEXOL 350 MG/ML SOLN COMPARISON:  None. FINDINGS: Cardiovascular: Thoracic aortic atherosclerosis without acute aneurysm or dissection. Patent 3 vessel arch anatomy. Pulmonary arteries appear patent without acute filling defect or pulmonary embolus. Segmental pulmonary branches to the right middle lobe are compressed by the right middle lobe mass described below. Normal heart size. Native coronary atherosclerosis noted. No pericardial effusion. Mediastinum/Nodes: Bulky right paratracheal adenopathy measures 6  x 5.3 cm. Enlarged central left AP window lymph node measures 2.8 x 3.1 cm. Subcarinal adenopathy measures 3.2 x 2.9 cm. These are compatible with mediastinal metastatic adenopathy. Suspect additional ill-defined and difficult to measure right hilar adenopathy. Lungs/Pleura: Large central bulky right middle lobe mass measures approximately 7 x 4.7 x 6 cm. Mass does appear to invade into the right hilum with encasement of the central bronchial structures and abutting against the left atrium and right inferior pulmonary vein. 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 There is complete collapse or invasion of the right middle lobe bronchi. Bronchus intermedius also significantly encased and narrowed proximally. Central right lung surrounding interstitial micro nodularity suggesting right lung regional lymphangitic tumor spread in the right middle lobe and right upper lobe. Mild nonspecific right upper lobe patchy peribronchovascular airspace opacities. Left lung remains clear. Small non loculated right pleural effusion with associated right basilar  subsegmental atelectasis. Upper Abdomen: Enlarged upper abdomen gastric hepatic ligament lymph node measures 1.6 cm, image 82/4. No acute upper abdominal finding. Abdominal atherosclerosis noted. Musculoskeletal: Degenerative changes of the spine. No acute osseous finding. No osseous lytic or blastic disease. Sternum intact. Review of the MIP images confirms the above findings. IMPRESSION: Large bulky central right middle lobe mass measuring up to 7 cm compatible with a primary bronchogenic carcinoma with evidence of bulky metastatic mediastinal adenopathy and suspect surrounding right middle lobe and right upper lobe lymphangitic tumor spread. Also enlarged upper abdominal gastrohepatic lymph nodes suspicious for metastatic disease. Associated trace right pleural effusion and right basilar atelectasis. Negative for significant acute pulmonary embolus. Aortic Atherosclerosis (ICD10-I70.0). These results were called by telephone at the time of interpretation on 02/27/2021 at 11:28 am to provider Dr. Alwyn Ren, who verbally acknowledged these results. Electronically Signed   By: Jerilynn Mages.  Shick M.D.   On: 02/27/2021 11:30    ASSESSMENT: Right middle lobe lung mass.  PLAN:    Right middle lobe lung mass: CT scan results from February 27, 2021 reviewed independently and reported as above with bulky 7 cm right middle lobe mass invading right hilum with mediastinal lymphadenopathy highly suspicious for underlying bronchogenic malignancy.  Appreciate pulmonary input who plans to do bronchoscopy next week as an outpatient.  Patient will also need a PET scan that can completed up after discharge.  Will get MRI of the brain for staging purposes while patient is in the hospital. Rapid A. fib: Patient receiving IV Cardizem.  Possibly secondary to irritation from underlying lung mass.    Appreciate consult, will follow.   [name], MD   02/28/2021 4:41 PM

## 2021-02-28 NOTE — Progress Notes (Signed)
   02/28/21 1229  Assess: MEWS Score  Temp 98.2 F (36.8 C)  BP 102/73  Pulse Rate (!) 131  Resp 16  SpO2 97 %  O2 Device Nasal Cannula  Assess: MEWS Score  MEWS Temp 0  MEWS Systolic 0  MEWS Pulse 3  MEWS RR 0  MEWS LOC 0  MEWS Score 3  MEWS Score Color Yellow  Assess: if the MEWS score is Yellow or Red  Were vital signs taken at a resting state? Yes  Focused Assessment Change from prior assessment (see assessment flowsheet)  Does the patient meet 2 or more of the SIRS criteria? No  MEWS guidelines implemented *See Row Information* Yes  Treat  MEWS Interventions Escalated (See documentation below)  Pain Scale 0-10  Pain Score 0  Take Vital Signs  Increase Vital Sign Frequency  Yellow: Q 2hr X 2 then Q 4hr X 2, if remains yellow, continue Q 4hrs  Escalate  MEWS: Escalate Yellow: discuss with charge nurse/RN and consider discussing with provider and RRT  Notify: Charge Nurse/RN  Name of Charge Nurse/RN Notified Wilder Glade  Date Charge Nurse/RN Notified 02/28/21  Time Charge Nurse/RN Notified 2761  Notify: Provider  Provider Name/Title Cindie Laroche  Date Provider Notified 02/28/21  Time Provider Notified 1212  Notification Type Page (secure chat)  Notification Reason Change in status  Provider response See new orders  Date of Provider Response 02/28/21  Time of Provider Response 1215  Assess: SIRS CRITERIA  SIRS Temperature  0  SIRS Pulse 1  SIRS Respirations  0  SIRS WBC 0  SIRS Score Sum  1

## 2021-02-28 NOTE — ED Notes (Signed)
Lab at the bedside 

## 2021-02-28 NOTE — ED Notes (Signed)
Message sent to pharmacy regarding thyroid medication

## 2021-03-01 ENCOUNTER — Encounter: Payer: Self-pay | Admitting: *Deleted

## 2021-03-01 DIAGNOSIS — R918 Other nonspecific abnormal finding of lung field: Secondary | ICD-10-CM

## 2021-03-01 LAB — BASIC METABOLIC PANEL
Anion gap: 13 (ref 5–15)
BUN: 17 mg/dL (ref 8–23)
CO2: 24 mmol/L (ref 22–32)
Calcium: 10.5 mg/dL — ABNORMAL HIGH (ref 8.9–10.3)
Chloride: 93 mmol/L — ABNORMAL LOW (ref 98–111)
Creatinine, Ser: 1 mg/dL (ref 0.44–1.00)
GFR, Estimated: 59 mL/min — ABNORMAL LOW (ref >60)
Glucose, Bld: 175 mg/dL — ABNORMAL HIGH (ref 70–99)
Potassium: 3.8 mmol/L (ref 3.5–5.1)
Sodium: 130 mmol/L — ABNORMAL LOW (ref 135–145)

## 2021-03-01 LAB — CBC
HCT: 34.4 % — ABNORMAL LOW (ref 36.0–46.0)
Hemoglobin: 11.3 g/dL — ABNORMAL LOW (ref 12.0–15.0)
MCH: 29.5 pg (ref 26.0–34.0)
MCHC: 32.8 g/dL (ref 30.0–36.0)
MCV: 89.8 fL (ref 80.0–100.0)
Platelets: 442 10*3/uL — ABNORMAL HIGH (ref 150–400)
RBC: 3.83 MIL/uL — ABNORMAL LOW (ref 3.87–5.11)
RDW: 13.5 % (ref 11.5–15.5)
WBC: 7.8 10*3/uL (ref 4.0–10.5)
nRBC: 0 % (ref 0.0–0.2)

## 2021-03-01 LAB — MAGNESIUM: Magnesium: 2 mg/dL (ref 1.7–2.4)

## 2021-03-01 MED ORDER — DILTIAZEM HCL ER COATED BEADS 240 MG PO CP24: 240.0000 mg | ORAL_CAPSULE | Freq: Every day | ORAL | 2 refills | Status: AC

## 2021-03-01 MED ORDER — DOCUSATE SODIUM 100 MG PO CAPS
100.0000 mg | ORAL_CAPSULE | Freq: Two times a day (BID) | ORAL | Status: DC | PRN
  Administered 2021-03-01: 100 mg via ORAL
  Filled 2021-03-01: qty 1

## 2021-03-01 MED ORDER — ASPIRIN 81 MG PO TBEC: DELAYED_RELEASE_TABLET | ORAL | 12 refills | Status: DC

## 2021-03-01 MED ORDER — AZITHROMYCIN 500 MG PO TABS: 500.0000 mg | ORAL_TABLET | Freq: Every day | ORAL | 0 refills | Status: AC

## 2021-03-01 MED ORDER — BREZTRI AEROSPHERE 160-9-4.8 MCG/ACT IN AERO: 1.0000 | INHALATION_SPRAY | Freq: Two times a day (BID) | RESPIRATORY_TRACT | 2 refills | Status: AC

## 2021-03-01 MED ORDER — CALCIUM CARBONATE ANTACID 500 MG PO CHEW: 1.0000 | CHEWABLE_TABLET | Freq: Three times a day (TID) | ORAL | Status: DC | PRN

## 2021-03-01 MED ORDER — ALUM & MAG HYDROXIDE-SIMETH 200-200-20 MG/5ML PO SUSP
15.0000 mL | Freq: Four times a day (QID) | ORAL | Status: DC | PRN
Start: 2021-03-01 — End: 2021-03-01

## 2021-03-01 MED ORDER — DILTIAZEM HCL ER COATED BEADS 120 MG PO CP24
240.0000 mg | ORAL_CAPSULE | Freq: Every day | ORAL | Status: DC
  Administered 2021-03-01: 240 mg via ORAL
  Filled 2021-03-01: qty 2

## 2021-03-01 MED ORDER — PREDNISONE 20 MG PO TABS: 40.0000 mg | ORAL_TABLET | Freq: Every day | ORAL | 0 refills | Status: AC

## 2021-03-01 MED ORDER — IPRATROPIUM-ALBUTEROL 0.5-2.5 (3) MG/3ML IN SOLN: 3.0000 mL | Freq: Four times a day (QID) | RESPIRATORY_TRACT | 2 refills | Status: AC | PRN

## 2021-03-01 MED ORDER — GUAIFENESIN-DM 100-10 MG/5ML PO SYRP: 10.0000 mL | ORAL_SOLUTION | Freq: Four times a day (QID) | ORAL | 0 refills | Status: AC | PRN

## 2021-03-01 MED ORDER — POLYETHYLENE GLYCOL 3350 17 G PO PACK: 17.0000 g | PACK | Freq: Two times a day (BID) | ORAL | Status: DC | PRN

## 2021-03-01 MED ORDER — AZITHROMYCIN 250 MG PO TABS
500.0000 mg | ORAL_TABLET | Freq: Every day | ORAL | Status: DC
  Administered 2021-03-01: 500 mg via ORAL
  Filled 2021-03-01: qty 2

## 2021-03-01 MED ORDER — GUAIFENESIN-DM 100-10 MG/5ML PO SYRP
10.0000 mL | ORAL_SOLUTION | Freq: Four times a day (QID) | ORAL | Status: DC | PRN
  Administered 2021-03-01: 10 mL via ORAL
  Filled 2021-03-01: qty 10

## 2021-03-01 MED ORDER — PNEUMOCOCCAL VAC POLYVALENT 25 MCG/0.5ML IJ INJ: 0.5000 mL | INJECTION | INTRAMUSCULAR | 0 refills | Status: AC

## 2021-03-01 NOTE — Progress Notes (Signed)
Referral received for patient with lung mass. Per Dr. [name], pt will need outpatient PET scan. Will initiate authorization process while pt still admitted. Once approved, will schedule PET and notify pt with appt. Will follow up with patient by phone this week or early next week.

## 2021-03-01 NOTE — TOC Progression Note (Signed)
Transition of Care [facility]) - Progression Note    Patient Details  Name: Brittany Wheeler MRN: [id number] Date of Birth: 1946/08/06  [facility] CM/SW Contact  Eileen Stanford, LCSW Phone Number: 03/01/2021, 1:10 PM  Clinical Narrative:   Pt refused 02 but accepted nebs at bedside.         Expected Discharge Plan and Services           Expected Discharge Date: 03/01/21                                     Social Determinants of Health (SDOH) Interventions    Readmission Risk Interventions No flowsheet data found.

## 2021-03-01 NOTE — Discharge Summary (Signed)
Physician Discharge Summary   Brittany Wheeler  female DOB: [birth year]  JFH:[id number]  PCP: [name], MD  Admit date: 02/27/2021 Discharge date: 03/01/2021  Admitted From: home Disposition:  home CODE STATUS: DNR  Discharge Instructions     Discharge instructions   Complete by: As directed    Please go to your scheduled outpatient bronchoscopy with biopsy on 03/06/21 with Dr. Humphrey Rolls.  Please finish 2 more days of azithromycin and prednisone starting tomorrow 7/28 for your COPD flare up.  You went into Afib with high heart rate.  I am starting you on a new medication Cardizem to control your heart rate.  You are not started on blood thinner for stroke prevention due to your upcoming biopsy.  Please follow up with your PCP for further management of your Afib, or have PCP refer you to a cardiologist.  Your oxygen saturation level didn't drop with walking, but we are sending you home with oxygen to relieve your symptoms of shortness of breath, since you do have lung cancer.   Dr. Enzo Bi [facility] Course:  For full details, please see H&P, progress notes, consult notes and ancillary notes.  Briefly,  Brittany Wheeler is a 75 y.o. female with medical history significant for hypothyroidism, hypertension, dyslipidemia and depression who presented to the emergency room for evaluation of worsening shortness of breath with minimal exertion.  Patient states that she has had symptoms for over 2 weeks and initially was short of breath with moderate exertion but now even at rest she is short of breath.  Shortness of breath is associated with a cough productive of yellow phlegm.     Patient had an outpatient chest x-ray done due to persistence of her symptoms and it showed a right lung mass.  She had been scheduled for outpatient bronch for biopsy.   Dyspnea 2/2 Lung mass --not hypoxic. 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 --No signs of infection, so abx was not started. --pulm Dr. Humphrey Rolls consulted, due to pt being on ASA, can not perform tissue biopsy now.  Will perform bronch on her previously scheduled time on 8/1. --hold home ASA --MRI brain neg for mets. --oncology consulted and Dr. [name] will follow pt after discharge. --Pt was not hypoxic, however, 2L supplemental O2 ordered for home use for symptom relief.   # presumed COPD of unknown severity with acute exacerbation --recently prescribed Breztri as outpatient --started on azithromycin.  started steroid per pulm rec, with prednisone 40 mg daily --DuoNeb scheduled --Pt was discharged on 2 more days of azithromycin and prednisone. --Neb machine ordered for pt prior to discharge.   # paroxysmal Afib w RVR --new prior hx of known Afib, per pt --HR remained elevated despite IV dilt, os pt was transferred to PCU. --started oral dilt 60 mg q6h and rate controlled by next morning.  Pt was discharged on Cardizem 240 mg daily. --will not start anticoagulation since pt needs to be off blood thinner prior to biopsy.  Defer to outpatient provider for decision on anticoagulation.   HTN --cont home HCTZ and start cardizem    Depression Continue Paxil   Anxiety --Ativan 1mg  TID PRN ordered while inpatient, not continued at discharge.   Hypothyroidism Continue Synthroid   Discharge Diagnoses:  Principal Problem:   Lung mass Active Problems:  Acute respiratory failure (HCC)   Hypertension   Thyroid disease   Depression   30 Day Unplanned Readmission Risk Score    Flowsheet Row ED to Hosp-Admission (Current) from 02/27/2021 in [place] [facility] PCU  30 Day Unplanned Readmission Risk Score (%) 10.83 Filed at 03/01/2021 0801       This score is the patient's risk of an unplanned readmission within 30 days of being discharged (0 -100%). The score is based  on dignosis, age, lab data, medications, orders, and past utilization.   Low:  0-14.9   Medium: 15-21.9   High: 22-29.9   Extreme: 30 and above         Discharge Instructions:  Allergies as of 03/01/2021       Reactions   Latex    Penicillins         Medication List     TAKE these medications    aspirin 81 MG EC tablet Hold until after your biopsy and after Dr. Humphrey Rolls says ok to resume. What changed:  how much to take how to take this when to take this additional instructions   azithromycin 500 MG tablet Commonly known as: ZITHROMAX Take 1 tablet (500 mg total) by mouth daily for 2 days. Start taking on: March 02, 2021   Breztri Aerosphere 160-9-4.8 MCG/ACT Aero Generic drug: Budeson-Glycopyrrol-Formoterol Inhale 1 spray into the lungs 2 (two) times daily.   diltiazem 240 MG 24 hr capsule Commonly known as: CARDIZEM CD Take 1 capsule (240 mg total) by mouth daily. To control your heart rate from Afib.   fluticasone 50 MCG/ACT nasal spray Commonly known as: FLONASE Place 1 spray into both nostrils in the morning and at bedtime.   guaiFENesin-dextromethorphan 100-10 MG/5ML syrup Commonly known as: ROBITUSSIN DM Take 10 mLs by mouth every 6 (six) hours as needed for cough.   hydrochlorothiazide 12.5 MG tablet Commonly known as: HYDRODIURIL Take 12.5 mg by mouth daily.   ipratropium 0.06 % nasal spray Commonly known as: ATROVENT Place 2 sprays into both nostrils 3 (three) times daily.   ipratropium-albuterol 0.5-2.5 (3) MG/3ML Soln Commonly known as: DUONEB Take 3 mLs by nebulization every 6 (six) hours as needed.   levocetirizine 5 MG tablet Commonly known as: XYZAL Take 5 mg by mouth daily as needed.   levothyroxine 112 MCG tablet Commonly known as: SYNTHROID Take 112 mcg by mouth daily.   Multi-Vitamin tablet Take 1 tablet by mouth daily.   PARoxetine 20 MG tablet Commonly known as: PAXIL Take 20 mg by mouth daily.   pneumococcal 23 valent  vaccine 25 MCG/0.5ML injection Commonly known as: PNEUMOVAX-23 Inject 0.5 mLs into the muscle tomorrow at 10 am for 1 dose.   predniSONE 20 MG tablet Commonly known as: DELTASONE Take 2 tablets (40 mg total) by mouth daily with breakfast for 2 days. Start taking on: March 02, 2021               Durable Medical Equipment  (From admission, onward)           Start     Ordered   03/01/21 1046  For home use only DME Nebulizer machine  Once       Question Answer Comment  Patient needs a nebulizer to treat with the following condition COPD (chronic obstructive pulmonary disease) ([place])   Length of Need Lifetime      03/01/21 1045   03/01/21 1021  For home use only DME oxygen  Once  Question Answer Comment  Length of Need 6 Months   Mode or (Route) Nasal cannula   Liters per Minute 2   Frequency Continuous (stationary and portable oxygen unit needed)   Oxygen delivery system Gas      03/01/21 1020             Follow-up Information     [name], MD Follow up on 03/08/2021.   Specialty: Family Medicine Why: @ 12:45pm Contact information: [place] [address] [phone]         [name], MD Follow up on 03/07/2021.   Specialty: Oncology Why: @ 9:15am Contact information: [place] [address] [phone]                 Allergies  Allergen Reactions   Latex    Penicillins      The results of significant diagnostics from this hospitalization (including imaging, microbiology, ancillary and laboratory) are listed below for reference.   Consultations:   Procedures/Studies: CT Angio Chest PE W and/or Wo Contrast  Result Date: 02/27/2021 CLINICAL DATA:  Chest pain and shortness of breath, wheezing EXAM: CT ANGIOGRAPHY CHEST WITH CONTRAST TECHNIQUE: Multidetector CT imaging of the chest was performed using the standard protocol during bolus administration  of intravenous contrast. Multiplanar CT image reconstructions and MIPs were obtained to evaluate the vascular anatomy. CONTRAST:  109mL OMNIPAQUE IOHEXOL 350 MG/ML SOLN COMPARISON:  None. FINDINGS: Cardiovascular: Thoracic aortic atherosclerosis without acute aneurysm or dissection. Patent 3 vessel arch anatomy. Pulmonary arteries appear patent without acute filling defect or pulmonary embolus. Segmental pulmonary branches to the right middle lobe are compressed by the right middle lobe mass described below. Normal heart size. Native coronary atherosclerosis noted. No pericardial effusion. Mediastinum/Nodes: Bulky right paratracheal adenopathy measures 6 x 5.3 cm. Enlarged central left AP window lymph node measures 2.8 x 3.1 cm. Subcarinal adenopathy measures 3.2 x 2.9 cm. These are compatible with mediastinal metastatic adenopathy. Suspect additional ill-defined and difficult to measure right hilar adenopathy. Lungs/Pleura: Large central bulky right middle lobe mass measures approximately 7 x 4.7 x 6 cm. Mass does appear to invade into the right hilum with encasement of the central bronchial structures and abutting against the left atrium and right inferior pulmonary vein. 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 There is complete collapse or invasion of the right middle lobe bronchi. Bronchus intermedius also significantly encased and narrowed proximally. Central right lung surrounding interstitial micro nodularity suggesting right lung regional lymphangitic tumor spread in the right middle lobe and right upper lobe. Mild nonspecific right upper lobe patchy peribronchovascular airspace opacities. Left lung remains clear. Small non loculated right pleural effusion with associated right basilar subsegmental atelectasis. Upper Abdomen: Enlarged upper abdomen gastric hepatic ligament lymph node measures 1.6 cm, image 82/4. No acute upper abdominal finding. Abdominal atherosclerosis noted.  Musculoskeletal: Degenerative changes of the spine. No acute osseous finding. No osseous lytic or blastic disease. Sternum intact. Review of the MIP images confirms the above findings. IMPRESSION: Large bulky central right middle lobe mass measuring up to 7 cm compatible with a primary bronchogenic carcinoma with evidence of bulky metastatic mediastinal adenopathy and suspect surrounding right middle lobe and right upper lobe lymphangitic tumor spread. Also enlarged upper abdominal gastrohepatic lymph nodes suspicious for metastatic disease. Associated trace right pleural effusion and right basilar atelectasis. Negative for significant acute pulmonary embolus. Aortic Atherosclerosis (ICD10-I70.0). These results were called by telephone at the time  of interpretation on 02/27/2021 at 11:28 am to provider Dr. Alwyn Ren, who verbally acknowledged these results. Electronically Signed   By: Jerilynn Mages.  Shick M.D.   On: 02/27/2021 11:30   MR BRAIN W WO CONTRAST  Result Date: 02/28/2021 CLINICAL DATA:  Lung cancer, staging EXAM: MRI HEAD WITHOUT AND WITH CONTRAST TECHNIQUE: Multiplanar, multiecho pulse sequences of the brain and surrounding structures were obtained without and with intravenous contrast. CONTRAST:  38mL GADAVIST GADOBUTROL 1 MMOL/ML IV SOLN COMPARISON:  None. FINDINGS: Brain: There is no acute infarction or intracranial hemorrhage. There is no intracranial mass, mass effect, or edema. There is no hydrocephalus or extra-axial fluid collection. Ventricles and sulci are within normal limits in size and configuration. Patchy and confluent areas of T2 hyperintensity in the supratentorial and pontine white matter are nonspecific may reflect mild to moderate chronic microvascular ischemic changes. No abnormal enhancement. Vascular: Major vessel flow voids at the skull base are preserved. Skull and upper cervical spine: Normal marrow signal is preserved. Sinuses/Orbits: Paranasal sinuses are aerated. Orbits are  unremarkable. Other: Sella is unremarkable.  Mastoid air cells are clear. IMPRESSION: No evidence of intracranial metastatic disease. Chronic microvascular ischemic changes. Electronically Signed   By: Macy Mis M.D.   On: 02/28/2021 20:57      Labs: BNP (last 3 results) Recent Labs    02/27/21 1011  BNP 97.9   Basic Metabolic Panel: Recent Labs  Lab 02/27/21 1011 02/28/21 0546 03/01/21 0611  NA 134* 134* 130*  K 3.9 3.5 3.8  CL 101 99 93*  CO2 25 27 24   GLUCOSE 127* 135* 175*  BUN 17 15 17   CREATININE 0.87 0.82 1.00  CALCIUM 10.4* 10.3 10.5*  MG  --   --  2.0   Liver Function Tests: No results for input(s): AST, ALT, ALKPHOS, BILITOT, PROT, ALBUMIN in the last 168 hours. No results for input(s): LIPASE, AMYLASE in the last 168 hours. No results for input(s): AMMONIA in the last 168 hours. CBC: Recent Labs  Lab 02/27/21 1011 02/28/21 0546 03/01/21 0611  WBC 8.8 9.9 7.8  HGB 12.0 11.6* 11.3*  HCT 36.4 34.6* 34.4*  MCV 89.0 89.4 89.8  PLT 406* 413* 442*   Cardiac Enzymes: No results for input(s): CKTOTAL, CKMB, CKMBINDEX, TROPONINI in the last 168 hours. BNP: Invalid input(s): POCBNP CBG: No results for input(s): GLUCAP in the last 168 hours. D-Dimer No results for input(s): DDIMER in the last 72 hours. Hgb A1c No results for input(s): HGBA1C in the last 72 hours. Lipid Profile No results for input(s): CHOL, HDL, LDLCALC, TRIG, CHOLHDL, LDLDIRECT in the last 72 hours. Thyroid function studies No results for input(s): TSH, T4TOTAL, T3FREE, THYROIDAB in the last 72 hours.  Invalid input(s): FREET3 Anemia work up No results for input(s): VITAMINB12, FOLATE, FERRITIN, TIBC, IRON, RETICCTPCT in the last 72 hours. Urinalysis No results found for: COLORURINE, APPEARANCEUR, [place], [place], GLUCOSEU, [place], [place], [place], PROTEINUR, UROBILINOGEN, NITRITE, LEUKOCYTESUR Sepsis Labs Invalid input(s): PROCALCITONIN,  WBC,   LACTICIDVEN Microbiology Recent Results (from the past 240 hour(s))  Resp Panel by RT-PCR (Flu A&B, Covid) Nasopharyngeal Swab     Status: None   Collection Time: 02/27/21  2:10 PM   Specimen: Nasopharyngeal Swab; Nasopharyngeal(NP) swabs in vial transport medium  Result Value Ref Range Status   SARS Coronavirus 2 by RT PCR NEGATIVE NEGATIVE Final    Comment: (NOTE) SARS-CoV-2 target nucleic acids are NOT DETECTED.  The SARS-CoV-2 RNA is generally detectable in upper respiratory specimens during the acute  phase of infection. The lowest concentration of SARS-CoV-2 viral copies this assay can detect is 138 copies/mL. A negative result does not preclude SARS-Cov-2 infection and should not be used as the sole basis for treatment or other patient management decisions. A negative result may occur with  improper specimen collection/handling, submission of specimen other than nasopharyngeal swab, presence of viral mutation(s) within the areas targeted by this assay, and inadequate number of viral copies(<138 copies/mL). A negative result must be combined with clinical observations, patient history, and epidemiological information. The expected result is Negative.  Fact Sheet for Patients:  EntrepreneurPulse.com.au  Fact Sheet for Healthcare Providers:  IncredibleEmployment.be  This test is no t yet approved or cleared by the [place] FDA and  has been authorized for detection and/or diagnosis of SARS-CoV-2 by FDA under an Emergency Use Authorization (EUA). This EUA will remain  in effect (meaning this test can be used) for the duration of the COVID-19 declaration under Section 564(b)(1) of the Act, 21 U.S.C.section 360bbb-3(b)(1), unless the authorization is terminated  or revoked sooner.       Influenza A by PCR NEGATIVE NEGATIVE Final   Influenza B by PCR NEGATIVE NEGATIVE Final    Comment: (NOTE) The Xpert Xpress SARS-CoV-2/FLU/RSV plus  assay is intended as an aid in the diagnosis of influenza from Nasopharyngeal swab specimens and should not be used as a sole basis for treatment. Nasal washings and aspirates are unacceptable for Xpert Xpress SARS-CoV-2/FLU/RSV testing.  Fact Sheet for Patients: EntrepreneurPulse.com.au  Fact Sheet for Healthcare Providers: IncredibleEmployment.be  This test is not yet approved or cleared by the [place] FDA and has been authorized for detection and/or diagnosis of SARS-CoV-2 by FDA under an Emergency Use Authorization (EUA). This EUA will remain in effect (meaning this test can be used) for the duration of the COVID-19 declaration under Section 564(b)(1) of the Act, 21 U.S.C. section 360bbb-3(b)(1), unless the authorization is terminated or revoked.  Performed at [facility], [address] [place], [place] [address]      Total time spend on discharging this patient, including the last patient exam, discussing the hospital stay, instructions for ongoing care as it relates to all pertinent caregivers, as well as preparing the medical discharge records, prescriptions, and/or referrals as applicable, is 60 minutes.    Enzo Bi, MD  [facility] 03/01/2021, 10:50 AM

## 2021-03-03 ENCOUNTER — Other Ambulatory Visit
Admission: RE | Admit: 2021-03-03 | Discharge: 2021-03-03 | Disposition: A | Discharge status: Home / Self Care | Payer: Medicare Other | Source: Ambulatory Visit | Attending: Pulmonary Disease | Admitting: Pulmonary Disease

## 2021-03-03 ENCOUNTER — Encounter: Payer: Self-pay | Admitting: Urgent Care

## 2021-03-03 ENCOUNTER — Other Ambulatory Visit: Payer: Self-pay

## 2021-03-03 ENCOUNTER — Encounter: Payer: Self-pay | Admitting: *Deleted

## 2021-03-03 HISTORY — DX: Dyspnea, unspecified: R06.00

## 2021-03-03 HISTORY — DX: Nausea with vomiting, unspecified: R11.2

## 2021-03-03 HISTORY — DX: Malignant (primary) neoplasm, unspecified: C80.1

## 2021-03-03 HISTORY — DX: Other complications of anesthesia, initial encounter: T88.59XA

## 2021-03-03 HISTORY — DX: Unspecified osteoarthritis, unspecified site: M19.90

## 2021-03-03 HISTORY — DX: Anxiety disorder, unspecified: F41.9

## 2021-03-03 HISTORY — DX: Hypothyroidism, unspecified: E03.9

## 2021-03-03 HISTORY — DX: Unspecified asthma, uncomplicated: J45.909

## 2021-03-03 HISTORY — DX: Other specified postprocedural states: Z98.890

## 2021-03-03 HISTORY — DX: Prediabetes: R73.03

## 2021-03-03 HISTORY — DX: Cardiac arrhythmia, unspecified: I49.9

## 2021-03-03 NOTE — Progress Notes (Signed)
Phone call made to pt's friend, [name], to review upcoming appts. Introduced to navigator services. All questions answered during call. Contact info given and instructed to call with any questions or needs. Ginny verbalized understanding.

## 2021-03-03 NOTE — Patient Instructions (Addendum)
Your procedure is scheduled on: 03/06/21 Report to the Registration Desk on the 1st floor of the [facility]. To find out your arrival time, please call [phone] between 1PM - 3PM on: 03/03/21  REMEMBER: Instructions that are not followed completely may result in serious medical risk, up to and including death; or upon the discretion of your surgeon and anesthesiologist your surgery may need to be rescheduled.  Do not eat food or drink any fluids after midnight the night before surgery.  No gum chewing, lozengers or hard candies.  TAKE THESE MEDICATIONS THE MORNING OF SURGERY WITH A SIP OF WATER:  - Budeson-Glycopyrrol-Formoterol (BREZTRI AEROSPHERE) 160-9-4.8 MCG/ACT AERO - diltiazem (CARDIZEM CD) 240 MG 24 hr capsule - fluticasone (FLONASE) 50 MCG/ACT nasal spray - ipratropium (ATROVENT) 0.06 % nasal spray - levothyroxine (SYNTHROID) 112 MCG tablet  Use inhalers on the day of surgery and bring to the hospital.  Follow recommendations from Cardiologist, Pulmonologist or PCP regarding stopping Aspirin, Coumadin, Plavix, Eliquis, Pradaxa, or Pletal. Hold ASPIRIN 81 mg until after your biopsy and after Dr. Humphrey Rolls says ok to resume.  One week prior to surgery: Stop Anti-inflammatories (NSAIDS) such as Advil, Aleve, Ibuprofen, Motrin, Naproxen, Naprosyn and Aspirin based products such as Excedrin, Goodys Powder, BC Powder.  Stop ANY OVER THE COUNTER supplements until after surgery.  You may take Tylenol if needed for pain up until the day of surgery.  No Alcohol for 24 hours before or after surgery.  No Smoking including e-cigarettes for 24 hours prior to surgery.  No chewable tobacco products for at least 6 hours prior to surgery.  No nicotine patches on the day of surgery.  Do not use any "recreational" drugs for at least a week prior to your surgery.  Please be advised that the combination of cocaine and anesthesia may have negative outcomes, up to and including death. If  you test positive for cocaine, your surgery will be cancelled.  On the morning of surgery brush your teeth with toothpaste and water, you may rinse your mouth with mouthwash if you wish. Do not swallow any toothpaste or mouthwash.  Do not wear jewelry, make-up, hairpins, clips or nail polish.  Do not wear lotions, powders, or perfumes.   Do not shave body from the neck down 48 hours prior to surgery just in case you cut yourself which could leave a site for infection.  Also, freshly shaved skin may become irritated if using the CHG soap.  Contact lenses, hearing aids and dentures may not be worn into surgery.  Do not bring valuables to the hospital. [facility] is not responsible for any missing/lost belongings or valuables.   Notify your doctor if there is any change in your medical condition (cold, fever, infection).  Wear comfortable clothing (specific to your surgery type) to the hospital.  After surgery, you can help prevent lung complications by doing breathing exercises.  Take deep breaths and cough every 1-2 hours. Your doctor may order a device called an Incentive Spirometer to help you take deep breaths. When coughing or sneezing, hold a pillow firmly against your incision with both hands. This is called "splinting." Doing this helps protect your incision. It also decreases belly discomfort.  If you are being admitted to the hospital overnight, leave your suitcase in the car. After surgery it may be brought to your room.  If you are being discharged the day of surgery, you will not be allowed to drive home. You will need a responsible adult (  18 years or older) to drive you home and stay with you that night.   If you are taking public transportation, you will need to have a responsible adult (18 years or older) with you. Please confirm with your physician that it is acceptable to use public transportation.   Please call the [place] Dept. at [phone] if  you have any questions about these instructions.  Surgery Visitation Policy:  Patients undergoing a surgery or procedure may have one family member or support person with them as long as that person is not COVID-19 positive or experiencing its symptoms.  That person may remain in the waiting area during the procedure.  Inpatient Visitation:    Visiting hours are 7 a.m. to 8 p.m. Inpatients will be allowed two visitors daily. The visitors may change each day during the patient's stay. No visitors under the age of 35. Any visitor under the age of 67 must be accompanied by an adult. The visitor must pass COVID-19 screenings, use hand sanitizer when entering and exiting the patient's room and wear a mask at all times, including in the patient's room. Patients must also wear a mask when staff or their visitor are in the room. Masking is required regardless of vaccination status.

## 2021-03-05 NOTE — H&P (View-Only) (Signed)
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  INTERVAL HISTORY: Patient is a 75 year old female initially evaluated in the hospital with a large right lung mass highly suspicious for underlying malignancy.  Bronchoscopy yesterday was abbreviated secondary to oxygen levels decreasing.  She feels she is declining with increasing weakness and fatigue and shortness of breath.  She has chronic cough that is unchanged.  She does not complain of pain.  She has no neurologic complaints.  She denies any recent fevers.  She has a fair appetite, but denies weight loss.  She has no chest pain or hemoptysis.  She has no nausea, vomiting, constipation, or diarrhea.  She has no urinary complaints.  Patient feels generally terrible, but offers no further specific complaints today.  REVIEW OF SYSTEMS:   Review of Systems  Constitutional:  Positive for malaise/fatigue. Negative for fever and weight loss.  Respiratory:  Positive for cough and shortness of breath. Negative for hemoptysis.   Cardiovascular: Negative.  Negative for chest pain and leg swelling.  Gastrointestinal: Negative.  Negative for abdominal pain and nausea.  Genitourinary: Negative.  Negative for dysuria.  Musculoskeletal: Negative.  Negative for back pain.  Skin: Negative.  Negative for itching and rash.  Neurological:  Positive for weakness. Negative for dizziness, focal weakness and headaches.  Psychiatric/Behavioral:  The patient is nervous/anxious.    As per HPI. Otherwise, a complete review of systems is negative.  PAST MEDICAL HISTORY: Past Medical History:  Diagnosis Date   Anxiety    Arthritis    Asthma    Cancer ([place])    benign mass removed    Complication of anesthesia    Dyspnea    Dysrhythmia    Hyperlipidemia    Hypertension    Hypothyroidism    PONV (postoperative nausea and vomiting)    Pre-diabetes    Thyroid disease     PAST SURGICAL HISTORY: Past Surgical History:  Procedure Laterality Date   COLONOSCOPY     stent placed in kidney     THYROIDECTOMY      FAMILY HISTORY: Family History  Problem Relation Age of Onset   Mesothelioma Father    Rectal cancer Sister     ADVANCED DIRECTIVES (Y/N):  N  HEALTH MAINTENANCE: Social History   Tobacco Use   Smoking status: Former    Types: Cigarettes   Smokeless tobacco: Never  Substance Use Topics   Alcohol use: Not Currently   Drug use: Never     Colonoscopy:  PAP:  Bone density:  Lipid panel:  Allergies  Allergen Reactions   Atorvastatin Other (See Comments)    Joint pain   Latex    Lovastatin Other (See Comments)    Arthritis pain in hands   Penicillins     Current Outpatient Medications  Medication Sig Dispense Refill   aspirin 81 MG EC tablet Hold until after your biopsy and after Dr. Humphrey Rolls says ok to resume. 30 tablet 12   Budeson-Glycopyrrol-Formoterol (BREZTRI AEROSPHERE) 160-9-4.8 MCG/ACT AERO Inhale 1 spray into the lungs 2 (two) times daily. 10.7 g 2   diltiazem (CARDIZEM CD) 240 MG 24 hr capsule Take 1 capsule (240 mg total) by mouth daily. To control your heart rate from Afib. 30 capsule 2   ezetimibe (ZETIA)  10 MG tablet TAKE 1 TABLET(10 MG) BY MOUTH EVERY DAY     fluticasone (FLONASE) 50 MCG/ACT nasal spray Place 1 spray into both nostrils in the morning and at bedtime.     guaiFENesin-dextromethorphan (ROBITUSSIN DM) 100-10 MG/5ML syrup Take 10 mLs by mouth every 6 (six) hours as needed for cough. 118 mL 0   hydrochlorothiazide (HYDRODIURIL) 12.5 MG tablet Take 12.5 mg by mouth daily.     ipratropium (ATROVENT) 0.06 % nasal spray Place 2 sprays into both nostrils 3 (three) times daily.     ipratropium-albuterol (DUONEB) 0.5-2.5  (3) MG/3ML SOLN Take 3 mLs by nebulization every 6 (six) hours as needed. 120 mL 2   levocetirizine (XYZAL) 5 MG tablet Take 5 mg by mouth daily as needed.     levothyroxine (SYNTHROID) 112 MCG tablet Take 112 mcg by mouth daily.     Multiple Vitamin (MULTI-VITAMIN) tablet Take 1 tablet by mouth daily.     PARoxetine (PAXIL) 20 MG tablet Take 20 mg by mouth daily.     ALPRAZolam (XANAX) 0.25 MG tablet Take 1 tablet (0.25 mg total) by mouth 2 (two) times daily as needed for anxiety. 60 tablet 0   No current facility-administered medications for this visit.    OBJECTIVE: Vitals:   03/07/21 0905  BP: 115/80  Pulse: 96  Resp: 20  Temp: (!) 96.1 F (35.6 C)  SpO2: 91%     Body mass index is 28.76 kg/m.    ECOG FS:1 - Symptomatic but completely ambulatory  General: Well-developed, well-nourished, no acute distress.  Sitting in a wheelchair. Eyes: Pink conjunctiva, anicteric sclera. HEENT: Normocephalic, moist mucous membranes. Lungs: No audible wheezing or coughing. Heart: Regular rate and rhythm. Abdomen: Soft, nontender, no obvious distention. Musculoskeletal: No edema, cyanosis, or clubbing. Neuro: Alert, answering all questions appropriately. Cranial nerves grossly intact. Skin: No rashes or petechiae noted. Psych: Normal affect.  LAB RESULTS:  Lab Results  Component Value Date   NA 130 (L) 03/01/2021   K 3.8 03/01/2021   CL 93 (L) 03/01/2021   CO2 24 03/01/2021   GLUCOSE 175 (H) 03/01/2021   BUN 17 03/01/2021   CREATININE 1.00 03/01/2021   CALCIUM 10.5 (H) 03/01/2021   GFRNONAA 59 (L) 03/01/2021    Lab Results  Component Value Date   WBC 7.8 03/01/2021   HGB 11.3 (L) 03/01/2021   HCT 34.4 (L) 03/01/2021   MCV 89.8 03/01/2021   PLT 442 (H) 03/01/2021     STUDIES: CT Angio Chest PE W and/or Wo Contrast  Result Date: 02/27/2021 CLINICAL DATA:  Chest pain and shortness of breath, wheezing EXAM: CT ANGIOGRAPHY CHEST WITH CONTRAST TECHNIQUE: Multidetector CT  imaging of the chest was performed using the standard protocol during bolus administration of intravenous contrast. Multiplanar CT image reconstructions and MIPs were obtained to evaluate the vascular anatomy. CONTRAST:  84mL OMNIPAQUE IOHEXOL 350 MG/ML SOLN COMPARISON:  None. FINDINGS: Cardiovascular: Thoracic aortic atherosclerosis without acute aneurysm or dissection. Patent 3 vessel arch anatomy. Pulmonary arteries appear patent without acute filling defect or pulmonary embolus. Segmental pulmonary branches to the right middle lobe are compressed by the right middle lobe mass described below. Normal heart size. Native coronary atherosclerosis noted. No pericardial effusion. Mediastinum/Nodes: Bulky right paratracheal adenopathy measures 6 x 5.3 cm. Enlarged central left AP window lymph node measures 2.8 x 3.1 cm. Subcarinal adenopathy measures 3.2 x 2.9 cm. These are compatible with mediastinal metastatic adenopathy. Suspect additional ill-defined and difficult to measure right hilar adenopathy. Lungs/Pleura:  Large central bulky right middle lobe mass measures approximately 7 x 4.7 x 6 cm. Mass does appear to invade into the right hilum with encasement of the central bronchial structures and abutting against the left atrium and right inferior pulmonary vein. 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 There is complete collapse or invasion of the right middle lobe bronchi. Bronchus intermedius also significantly encased and narrowed proximally. Central right lung surrounding interstitial micro nodularity suggesting right lung regional lymphangitic tumor spread in the right middle lobe and right upper lobe. Mild nonspecific right upper lobe patchy peribronchovascular airspace opacities. Left lung remains clear. Small non loculated right pleural effusion with associated right basilar subsegmental atelectasis. Upper Abdomen: Enlarged upper abdomen gastric hepatic ligament lymph node measures 1.6 cm,  image 82/4. No acute upper abdominal finding. Abdominal atherosclerosis noted. Musculoskeletal: Degenerative changes of the spine. No acute osseous finding. No osseous lytic or blastic disease. Sternum intact. Review of the MIP images confirms the above findings. IMPRESSION: Large bulky central right middle lobe mass measuring up to 7 cm compatible with a primary bronchogenic carcinoma with evidence of bulky metastatic mediastinal adenopathy and suspect surrounding right middle lobe and right upper lobe lymphangitic tumor spread. Also enlarged upper abdominal gastrohepatic lymph nodes suspicious for metastatic disease. Associated trace right pleural effusion and right basilar atelectasis. Negative for significant acute pulmonary embolus. Aortic Atherosclerosis (ICD10-I70.0). These results were called by telephone at the time of interpretation on 02/27/2021 at 11:28 am to provider Dr. Alwyn Ren, who verbally acknowledged these results. Electronically Signed   By: Jerilynn Mages.  Shick M.D.   On: 02/27/2021 11:30   MR BRAIN W WO CONTRAST  Result Date: 02/28/2021 CLINICAL DATA:  Lung cancer, staging EXAM: MRI HEAD WITHOUT AND WITH CONTRAST TECHNIQUE: Multiplanar, multiecho pulse sequences of the brain and surrounding structures were obtained without and with intravenous contrast. CONTRAST:  16mL GADAVIST GADOBUTROL 1 MMOL/ML IV SOLN COMPARISON:  None. FINDINGS: Brain: There is no acute infarction or intracranial hemorrhage. There is no intracranial mass, mass effect, or edema. There is no hydrocephalus or extra-axial fluid collection. Ventricles and sulci are within normal limits in size and configuration. Patchy and confluent areas of T2 hyperintensity in the supratentorial and pontine white matter are nonspecific may reflect mild to moderate chronic microvascular ischemic changes. No abnormal enhancement. Vascular: Major vessel flow voids at the skull base are preserved. Skull and upper cervical spine: Normal marrow signal  is preserved. Sinuses/Orbits: Paranasal sinuses are aerated. Orbits are unremarkable. Other: Sella is unremarkable.  Mastoid air cells are clear. IMPRESSION: No evidence of intracranial metastatic disease. Chronic microvascular ischemic changes. Electronically Signed   By: Macy Mis M.D.   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  Bronchoscopy yesterday had to be stopped early secondary to patient having recurrent desaturations.  MRI of the brain on February 28, 2021 reviewed independently and reported as above with no obvious metastatic disease.  She has a PET scan scheduled for next Monday.  Patient appears to have stage IIIb disease, although 1.6 cm gastrohepatic lymph node is suspicious.  Given patient's worsening symptoms, we will proceed directly with concurrent chemotherapy using weekly carboplatin and Taxol along with daily XRT.  Appreciate radiation oncology  input.  Return to clinic in 1 week to discuss her PET scan results and initiation of cycle 1 of treatment.   Rapid A. fib: Continue p.o. Cardizem. Anxiety: Patient was given a prescription for Xanax today. Shortness of breath, declining performance status: Patient was given a referral to home health as well as palliative care today.   Patient expressed understanding and was in agreement with this plan. She also understands that She can call clinic at any time with any questions, concerns, or complaints.   Cancer Staging Primary cancer of right middle lobe of lung [facility]) Staging form: Lung, AJCC 8th Edition - Clinical stage from 03/07/2021: Stage IIIB (cT4, cN2, cM0) - Signed by [name], MD on 03/07/2021 Stage prefix: Initial  diagnosis  [name], MD   03/07/2021 12:14 PM

## 2021-03-05 NOTE — Progress Notes (Signed)
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  INTERVAL HISTORY: Patient is a 75 year old female initially evaluated in the hospital with a large right lung mass highly suspicious for underlying malignancy.  Bronchoscopy yesterday was abbreviated secondary to oxygen levels decreasing.  She feels she is declining with increasing weakness and fatigue and shortness of breath.  She has chronic cough that is unchanged.  She does not complain of pain.  She has no neurologic complaints.  She denies any recent fevers.  She has a fair appetite, but denies weight loss.  She has no chest pain or hemoptysis.  She has no nausea, vomiting, constipation, or diarrhea.  She has no urinary complaints.  Patient feels generally terrible, but offers no further specific complaints today.  REVIEW OF SYSTEMS:   Review of Systems  Constitutional:  Positive for malaise/fatigue. Negative for fever and weight loss.  Respiratory:  Positive for cough and shortness of breath. Negative for hemoptysis.   Cardiovascular: Negative.  Negative for chest pain and leg swelling.  Gastrointestinal: Negative.  Negative for abdominal pain and nausea.  Genitourinary: Negative.  Negative for dysuria.  Musculoskeletal: Negative.  Negative for back pain.  Skin: Negative.  Negative for itching and rash.  Neurological:  Positive for weakness. Negative for dizziness, focal weakness and headaches.  Psychiatric/Behavioral:  The patient is nervous/anxious.    As per HPI. Otherwise, a complete review of systems is negative.  PAST MEDICAL HISTORY: Past Medical History:  Diagnosis Date   Anxiety    Arthritis    Asthma    Cancer ([place])    benign mass removed    Complication of anesthesia    Dyspnea    Dysrhythmia    Hyperlipidemia    Hypertension    Hypothyroidism    PONV (postoperative nausea and vomiting)    Pre-diabetes    Thyroid disease     PAST SURGICAL HISTORY: Past Surgical History:  Procedure Laterality Date   COLONOSCOPY     stent placed in kidney     THYROIDECTOMY      FAMILY HISTORY: Family History  Problem Relation Age of Onset   Mesothelioma Father    Rectal cancer Sister     ADVANCED DIRECTIVES (Y/N):  N  HEALTH MAINTENANCE: Social History   Tobacco Use   Smoking status: Former    Types: Cigarettes   Smokeless tobacco: Never  Substance Use Topics   Alcohol use: Not Currently   Drug use: Never     Colonoscopy:  PAP:  Bone density:  Lipid panel:  Allergies  Allergen Reactions   Atorvastatin Other (See Comments)    Joint pain   Latex    Lovastatin Other (See Comments)    Arthritis pain in hands   Penicillins     Current Outpatient Medications  Medication Sig Dispense Refill   aspirin 81 MG EC tablet Hold until after your biopsy and after Dr. Humphrey Rolls says ok to resume. 30 tablet 12   Budeson-Glycopyrrol-Formoterol (BREZTRI AEROSPHERE) 160-9-4.8 MCG/ACT AERO Inhale 1 spray into the lungs 2 (two) times daily. 10.7 g 2   diltiazem (CARDIZEM CD) 240 MG 24 hr capsule Take 1 capsule (240 mg total) by mouth daily. To control your heart rate from Afib. 30 capsule 2   ezetimibe (ZETIA)  10 MG tablet TAKE 1 TABLET(10 MG) BY MOUTH EVERY DAY     fluticasone (FLONASE) 50 MCG/ACT nasal spray Place 1 spray into both nostrils in the morning and at bedtime.     guaiFENesin-dextromethorphan (ROBITUSSIN DM) 100-10 MG/5ML syrup Take 10 mLs by mouth every 6 (six) hours as needed for cough. 118 mL 0   hydrochlorothiazide (HYDRODIURIL) 12.5 MG tablet Take 12.5 mg by mouth daily.     ipratropium (ATROVENT) 0.06 % nasal spray Place 2 sprays into both nostrils 3 (three) times daily.     ipratropium-albuterol (DUONEB) 0.5-2.5  (3) MG/3ML SOLN Take 3 mLs by nebulization every 6 (six) hours as needed. 120 mL 2   levocetirizine (XYZAL) 5 MG tablet Take 5 mg by mouth daily as needed.     levothyroxine (SYNTHROID) 112 MCG tablet Take 112 mcg by mouth daily.     Multiple Vitamin (MULTI-VITAMIN) tablet Take 1 tablet by mouth daily.     PARoxetine (PAXIL) 20 MG tablet Take 20 mg by mouth daily.     ALPRAZolam (XANAX) 0.25 MG tablet Take 1 tablet (0.25 mg total) by mouth 2 (two) times daily as needed for anxiety. 60 tablet 0   No current facility-administered medications for this visit.    OBJECTIVE: Vitals:   03/07/21 0905  BP: 115/80  Pulse: 96  Resp: 20  Temp: (!) 96.1 F (35.6 C)  SpO2: 91%     Body mass index is 28.76 kg/m.    ECOG FS:1 - Symptomatic but completely ambulatory  General: Well-developed, well-nourished, no acute distress.  Sitting in a wheelchair. Eyes: Pink conjunctiva, anicteric sclera. HEENT: Normocephalic, moist mucous membranes. Lungs: No audible wheezing or coughing. Heart: Regular rate and rhythm. Abdomen: Soft, nontender, no obvious distention. Musculoskeletal: No edema, cyanosis, or clubbing. Neuro: Alert, answering all questions appropriately. Cranial nerves grossly intact. Skin: No rashes or petechiae noted. Psych: Normal affect.  LAB RESULTS:  Lab Results  Component Value Date   NA 130 (L) 03/01/2021   K 3.8 03/01/2021   CL 93 (L) 03/01/2021   CO2 24 03/01/2021   GLUCOSE 175 (H) 03/01/2021   BUN 17 03/01/2021   CREATININE 1.00 03/01/2021   CALCIUM 10.5 (H) 03/01/2021   GFRNONAA 59 (L) 03/01/2021    Lab Results  Component Value Date   WBC 7.8 03/01/2021   HGB 11.3 (L) 03/01/2021   HCT 34.4 (L) 03/01/2021   MCV 89.8 03/01/2021   PLT 442 (H) 03/01/2021     STUDIES: CT Angio Chest PE W and/or Wo Contrast  Result Date: 02/27/2021 CLINICAL DATA:  Chest pain and shortness of breath, wheezing EXAM: CT ANGIOGRAPHY CHEST WITH CONTRAST TECHNIQUE: Multidetector CT  imaging of the chest was performed using the standard protocol during bolus administration of intravenous contrast. Multiplanar CT image reconstructions and MIPs were obtained to evaluate the vascular anatomy. CONTRAST:  64mL OMNIPAQUE IOHEXOL 350 MG/ML SOLN COMPARISON:  None. FINDINGS: Cardiovascular: Thoracic aortic atherosclerosis without acute aneurysm or dissection. Patent 3 vessel arch anatomy. Pulmonary arteries appear patent without acute filling defect or pulmonary embolus. Segmental pulmonary branches to the right middle lobe are compressed by the right middle lobe mass described below. Normal heart size. Native coronary atherosclerosis noted. No pericardial effusion. Mediastinum/Nodes: Bulky right paratracheal adenopathy measures 6 x 5.3 cm. Enlarged central left AP window lymph node measures 2.8 x 3.1 cm. Subcarinal adenopathy measures 3.2 x 2.9 cm. These are compatible with mediastinal metastatic adenopathy. Suspect additional ill-defined and difficult to measure right hilar adenopathy. Lungs/Pleura:  Large central bulky right middle lobe mass measures approximately 7 x 4.7 x 6 cm. Mass does appear to invade into the right hilum with encasement of the central bronchial structures and abutting against the left atrium and right inferior pulmonary vein. 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 There is complete collapse or invasion of the right middle lobe bronchi. Bronchus intermedius also significantly encased and narrowed proximally. Central right lung surrounding interstitial micro nodularity suggesting right lung regional lymphangitic tumor spread in the right middle lobe and right upper lobe. Mild nonspecific right upper lobe patchy peribronchovascular airspace opacities. Left lung remains clear. Small non loculated right pleural effusion with associated right basilar subsegmental atelectasis. Upper Abdomen: Enlarged upper abdomen gastric hepatic ligament lymph node measures 1.6 cm,  image 82/4. No acute upper abdominal finding. Abdominal atherosclerosis noted. Musculoskeletal: Degenerative changes of the spine. No acute osseous finding. No osseous lytic or blastic disease. Sternum intact. Review of the MIP images confirms the above findings. IMPRESSION: Large bulky central right middle lobe mass measuring up to 7 cm compatible with a primary bronchogenic carcinoma with evidence of bulky metastatic mediastinal adenopathy and suspect surrounding right middle lobe and right upper lobe lymphangitic tumor spread. Also enlarged upper abdominal gastrohepatic lymph nodes suspicious for metastatic disease. Associated trace right pleural effusion and right basilar atelectasis. Negative for significant acute pulmonary embolus. Aortic Atherosclerosis (ICD10-I70.0). These results were called by telephone at the time of interpretation on 02/27/2021 at 11:28 am to provider Dr. Alwyn Ren, who verbally acknowledged these results. Electronically Signed   By: Jerilynn Mages.  Shick M.D.   On: 02/27/2021 11:30   MR BRAIN W WO CONTRAST  Result Date: 02/28/2021 CLINICAL DATA:  Lung cancer, staging EXAM: MRI HEAD WITHOUT AND WITH CONTRAST TECHNIQUE: Multiplanar, multiecho pulse sequences of the brain and surrounding structures were obtained without and with intravenous contrast. CONTRAST:  16mL GADAVIST GADOBUTROL 1 MMOL/ML IV SOLN COMPARISON:  None. FINDINGS: Brain: There is no acute infarction or intracranial hemorrhage. There is no intracranial mass, mass effect, or edema. There is no hydrocephalus or extra-axial fluid collection. Ventricles and sulci are within normal limits in size and configuration. Patchy and confluent areas of T2 hyperintensity in the supratentorial and pontine white matter are nonspecific may reflect mild to moderate chronic microvascular ischemic changes. No abnormal enhancement. Vascular: Major vessel flow voids at the skull base are preserved. Skull and upper cervical spine: Normal marrow signal  is preserved. Sinuses/Orbits: Paranasal sinuses are aerated. Orbits are unremarkable. Other: Sella is unremarkable.  Mastoid air cells are clear. IMPRESSION: No evidence of intracranial metastatic disease. Chronic microvascular ischemic changes. Electronically Signed   By: Macy Mis M.D.   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  Bronchoscopy yesterday had to be stopped early secondary to patient having recurrent desaturations.  MRI of the brain on February 28, 2021 reviewed independently and reported as above with no obvious metastatic disease.  She has a PET scan scheduled for next Monday.  Patient appears to have stage IIIb disease, although 1.6 cm gastrohepatic lymph node is suspicious.  Given patient's worsening symptoms, we will proceed directly with concurrent chemotherapy using weekly carboplatin and Taxol along with daily XRT.  Appreciate radiation oncology  input.  Return to clinic in 1 week to discuss her PET scan results and initiation of cycle 1 of treatment.   Rapid A. fib: Continue p.o. Cardizem. Anxiety: Patient was given a prescription for Xanax today. Shortness of breath, declining performance status: Patient was given a referral to home health as well as palliative care today.   Patient expressed understanding and was in agreement with this plan. She also understands that She can call clinic at any time with any questions, concerns, or complaints.   Cancer Staging Primary cancer of right middle lobe of lung [facility]) Staging form: Lung, AJCC 8th Edition - Clinical stage from 03/07/2021: Stage IIIB (cT4, cN2, cM0) - Signed by [name], MD on 03/07/2021 Stage prefix: Initial  diagnosis  [name], MD   03/07/2021 12:14 PM

## 2021-03-06 ENCOUNTER — Encounter: Payer: Self-pay | Admitting: Internal Medicine

## 2021-03-06 ENCOUNTER — Other Ambulatory Visit: Payer: Self-pay

## 2021-03-06 ENCOUNTER — Ambulatory Visit
Admission: RE | Admit: 2021-03-06 | Discharge: 2021-03-06 | Disposition: A | Discharge status: Home / Self Care | Payer: Medicare Other | Attending: Internal Medicine | Admitting: Internal Medicine

## 2021-03-06 ENCOUNTER — Inpatient Hospital Stay: Admission: RE | Admit: 2021-03-06 | Payer: Medicare Other | Source: Ambulatory Visit

## 2021-03-06 ENCOUNTER — Telehealth: Payer: Self-pay | Admitting: *Deleted

## 2021-03-06 ENCOUNTER — Encounter
Admission: RE | Disposition: A | Discharge status: Home / Self Care | Payer: Self-pay | Source: Home / Self Care | Attending: Internal Medicine

## 2021-03-06 DIAGNOSIS — Z880 Allergy status to penicillin: Secondary | ICD-10-CM | POA: Insufficient documentation

## 2021-03-06 DIAGNOSIS — C342 Malignant neoplasm of middle lobe, bronchus or lung: Secondary | ICD-10-CM | POA: Insufficient documentation

## 2021-03-06 DIAGNOSIS — Z79890 Hormone replacement therapy (postmenopausal): Secondary | ICD-10-CM | POA: Diagnosis not present

## 2021-03-06 DIAGNOSIS — Z91040 Latex allergy status: Secondary | ICD-10-CM | POA: Insufficient documentation

## 2021-03-06 DIAGNOSIS — R918 Other nonspecific abnormal finding of lung field: Secondary | ICD-10-CM | POA: Diagnosis present

## 2021-03-06 DIAGNOSIS — Z79899 Other long term (current) drug therapy: Secondary | ICD-10-CM | POA: Diagnosis not present

## 2021-03-06 DIAGNOSIS — Z7982 Long term (current) use of aspirin: Secondary | ICD-10-CM | POA: Diagnosis not present

## 2021-03-06 DIAGNOSIS — Z85828 Personal history of other malignant neoplasm of skin: Secondary | ICD-10-CM | POA: Diagnosis not present

## 2021-03-06 DIAGNOSIS — R0989 Other specified symptoms and signs involving the circulatory and respiratory systems: Secondary | ICD-10-CM | POA: Diagnosis not present

## 2021-03-06 DIAGNOSIS — Z888 Allergy status to other drugs, medicaments and biological substances status: Secondary | ICD-10-CM | POA: Insufficient documentation

## 2021-03-06 DIAGNOSIS — Z87891 Personal history of nicotine dependence: Secondary | ICD-10-CM | POA: Insufficient documentation

## 2021-03-06 HISTORY — PX: FLEXIBLE BRONCHOSCOPY: SHX5094

## 2021-03-06 SURGERY — BRONCHOSCOPY, FLEXIBLE
Anesthesia: Moderate Sedation | Laterality: Bilateral

## 2021-03-06 MED ORDER — BUTAMBEN-TETRACAINE-BENZOCAINE 2-2-14 % EX AERO
1.0000 | INHALATION_SPRAY | Freq: Once | CUTANEOUS | Status: DC
  Filled 2021-03-06: qty 20

## 2021-03-06 MED ORDER — ORAL CARE MOUTH RINSE: 15.0000 mL | Freq: Once | OROMUCOSAL | Status: AC

## 2021-03-06 MED ORDER — CHLORHEXIDINE GLUCONATE 0.12 % MT SOLN: 15.0000 mL | Freq: Once | OROMUCOSAL | Status: AC

## 2021-03-06 MED ORDER — CHLORHEXIDINE GLUCONATE 0.12 % MT SOLN
OROMUCOSAL | Status: AC
  Administered 2021-03-06: 15 mL via OROMUCOSAL
  Filled 2021-03-06: qty 15

## 2021-03-06 MED ORDER — FAMOTIDINE 20 MG PO TABS: 20.0000 mg | ORAL_TABLET | Freq: Once | ORAL | Status: DC

## 2021-03-06 MED ORDER — PHENYLEPHRINE HCL 0.25 % NA SOLN
1.0000 | Freq: Four times a day (QID) | NASAL | Status: DC | PRN
  Filled 2021-03-06: qty 15

## 2021-03-06 MED ORDER — LIDOCAINE HCL URETHRAL/MUCOSAL 2 % EX GEL
1.0000 "application " | Freq: Once | CUTANEOUS | Status: DC
  Filled 2021-03-06: qty 5

## 2021-03-06 MED ORDER — MIDAZOLAM HCL 2 MG/2ML IJ SOLN
INTRAMUSCULAR | Status: DC | PRN
  Administered 2021-03-06 (×2): 2 mg via INTRAVENOUS

## 2021-03-06 MED ORDER — MIDAZOLAM HCL 2 MG/2ML IJ SOLN
INTRAMUSCULAR | Status: AC
  Filled 2021-03-06: qty 10

## 2021-03-06 MED ORDER — FENTANYL CITRATE (PF) 100 MCG/2ML IJ SOLN
INTRAMUSCULAR | Status: DC | PRN
  Administered 2021-03-06 (×2): 50 ug via INTRAVENOUS

## 2021-03-06 MED ORDER — FENTANYL CITRATE (PF) 100 MCG/2ML IJ SOLN
INTRAMUSCULAR | Status: AC
  Filled 2021-03-06: qty 6

## 2021-03-06 MED ORDER — LACTATED RINGERS IV SOLN: INTRAVENOUS | Status: DC

## 2021-03-06 NOTE — Progress Notes (Signed)
Procedure aborted due to low O2 saturations. Stimulation and head of bed raised. O2 returned to baseline

## 2021-03-06 NOTE — Discharge Instructions (Signed)

## 2021-03-06 NOTE — Telephone Encounter (Signed)
Per pt's friend, [name], they are concerned that she is declining and may not be able to tolerate chemo treatments. At this time, pt and family is interested in discussing radiation treatment and possibly palliative care/hospice. Informed Ginny that we can evaluate pt at her appt tomorrow with Dr. [name].   [name] stated that pt's cousin, Jackelyn Poling, will be coming in with her but that she would like to be called during visit to listen to conversation with Dr. [name]. Informed [name] that that would not be a problem and would let the clinical team know.   Ginny's cell # [phone].

## 2021-03-06 NOTE — Procedures (Signed)
Date: 03/06/2021,  MRN# [id number]    Procedure Note: Fiberoptic Bronchoscopy   PROCEDURE DATE: 03/06/2021     NAME:  Brittany Wheeler   DOB:January 27, 1946   MRN: [id number] LOC:  ARPO/None      Indications/Preliminary Diagnosis: Lung mass  Consent: (Place X beside choice/s below)  The benefits, risks and possible complications of the procedure were        explained to:  __X_ patient  ___ patient's family  ___ other:___________  who verbalized understanding and gave:  ___ verbal  ___ written  ___ verbal and written  ___ telephone  ___ other:________ consent.      Unable to obtain consent; procedure performed on emergent basis.     Other:      PRESEDATION ASSESSMENT: History and Physical has been performed. Patient meds and allergies have been reviewed. Presedation airway examination has been performed and documented. Baseline vital signs, sedation score, oxygenation status, and cardiac rhythm were reviewed. Patient was deemed to be in satisfactory condition to undergo the procedure.  PREMEDICATIONS:   Sedative/Narcotic Amt Dose   Versed 2 mg   Fentanyl 50 mcg  Diprivan  mg         Insertion Route (Place X beside choice below)   Nasal  X Oral   Endotracheal Tube   Tracheostomy   INTRAPROCEDURE MEDICATIONS:  Sedative/Narcotic Amt Dose   Versed 2 mg   Fentanyl 50 mcg  Diprivan  mg       Medication Amt Dose  Medication Amt Dose  Xylocaine 2%  cc  Epinephrine 1:10,000 sol  cc  Xylocaine 4%  cc  Cocaine  cc   TECHNICAL PROCEDURES: (Place X beside choice below)   Procedures  Description  X  None     Electrocautery     Cryotherapy     Balloon Dilatation     Bronchography     Stent Placement     Therapeutic Aspiration     Laser/Argon Plasma            SPECIMENS (Sites): (Place X beside choice below)  Specimens Description   No Specimens Obtained     Washings   X Lavage RML   Biopsies    Fine Needle Aspirates   X Brushings RML   Sputum     FINDINGS: Widening of the carina likely secondary to underlying enlarged lymph nodes.  Narrowing and extrinsic compression of the right middle lobe  ESTIMATED BLOOD LOSS: none  COMPLICATIONS/RESOLUTION: none  PROCEDURE DETAILS: Timeout performed and correct patient, name, & ID confirmed. Following prep per Pulmonary policy, appropriate sedation was administered.  Airway exam proceeded with findings, technical procedures, and specimen collection as noted below. At the end of exam the scope was withdrawn without incident. Impression and Plan as noted below.    Procedure Note: After patient was prepared in the usual manner fiberoptic scope was inserted through the mouth down to the vocal cords.  The vocal cords were anesthetized with lidocaine.  Next scope was passed easily down through the cords to the carina.  The carina was found to be widened as noted above.  The fiberoptic scope was inserted into the left lung upper lobe lingula and lower lobe were evaluated secretions were noted other than that not too remarkable.  Neck scope was inserted into the right lung.  Upper lobe was clear somewhat hyperemic.  Next the scope was inserted into the bronchus intermedius and there was extrinsic compression of the orifice into the right middle lobe.  This was traversed with the brush and found to have a minimal amount of bleeding.  Brushings were done x2.  Unfortunately patient dropped her saturation so the procedure was aborted at that point and withdrawn the scope.  She quickly recovered afterwards and it was decided to not do the biopsies at this point because of the desaturations that she experienced.    IMPRESSION:POST-PROCEDURE DX: Lung mass with extrinsic compression of the right midlung   RECOMMENDATION/PLAN: Await results of the brushings as well as the cytology of the lavage she may end up needing the CT-guided needle biopsy due to her exquisite sensitivity to sedation.  I have personally  performed the procedure as noted above     [name], MD [facility] Pulmonary Critical Care Medicine

## 2021-03-07 ENCOUNTER — Encounter: Payer: Self-pay | Admitting: Oncology

## 2021-03-07 ENCOUNTER — Inpatient Hospital Stay: Payer: Medicare Other | Attending: Oncology | Admitting: Oncology

## 2021-03-07 ENCOUNTER — Ambulatory Visit
Admission: RE | Admit: 2021-03-07 | Discharge: 2021-03-07 | Disposition: A | Discharge status: Home / Self Care | Payer: Medicare Other | Source: Ambulatory Visit | Attending: Radiation Oncology | Admitting: Radiation Oncology

## 2021-03-07 ENCOUNTER — Other Ambulatory Visit: Payer: Self-pay

## 2021-03-07 VITALS — BP 115/80 | HR 96 | Temp 96.1°F | Resp 20 | Ht 61.0 in | Wt 152.2 lb

## 2021-03-07 DIAGNOSIS — R531 Weakness: Secondary | ICD-10-CM | POA: Diagnosis not present

## 2021-03-07 DIAGNOSIS — C342 Malignant neoplasm of middle lobe, bronchus or lung: Secondary | ICD-10-CM

## 2021-03-07 DIAGNOSIS — Z800 Family history of malignant neoplasm of digestive organs: Secondary | ICD-10-CM | POA: Diagnosis not present

## 2021-03-07 DIAGNOSIS — R918 Other nonspecific abnormal finding of lung field: Secondary | ICD-10-CM | POA: Diagnosis present

## 2021-03-07 DIAGNOSIS — Z880 Allergy status to penicillin: Secondary | ICD-10-CM | POA: Diagnosis not present

## 2021-03-07 DIAGNOSIS — Z5111 Encounter for antineoplastic chemotherapy: Secondary | ICD-10-CM | POA: Diagnosis present

## 2021-03-07 DIAGNOSIS — Z79899 Other long term (current) drug therapy: Secondary | ICD-10-CM | POA: Diagnosis not present

## 2021-03-07 DIAGNOSIS — J90 Pleural effusion, not elsewhere classified: Secondary | ICD-10-CM | POA: Diagnosis not present

## 2021-03-07 DIAGNOSIS — E039 Hypothyroidism, unspecified: Secondary | ICD-10-CM | POA: Insufficient documentation

## 2021-03-07 DIAGNOSIS — Z808 Family history of malignant neoplasm of other organs or systems: Secondary | ICD-10-CM | POA: Insufficient documentation

## 2021-03-07 DIAGNOSIS — Z87891 Personal history of nicotine dependence: Secondary | ICD-10-CM | POA: Diagnosis not present

## 2021-03-07 DIAGNOSIS — E785 Hyperlipidemia, unspecified: Secondary | ICD-10-CM | POA: Insufficient documentation

## 2021-03-07 DIAGNOSIS — R630 Anorexia: Secondary | ICD-10-CM | POA: Diagnosis not present

## 2021-03-07 DIAGNOSIS — F418 Other specified anxiety disorders: Secondary | ICD-10-CM | POA: Insufficient documentation

## 2021-03-07 DIAGNOSIS — E876 Hypokalemia: Secondary | ICD-10-CM | POA: Insufficient documentation

## 2021-03-07 DIAGNOSIS — Z7901 Long term (current) use of anticoagulants: Secondary | ICD-10-CM | POA: Insufficient documentation

## 2021-03-07 DIAGNOSIS — M7989 Other specified soft tissue disorders: Secondary | ICD-10-CM | POA: Insufficient documentation

## 2021-03-07 DIAGNOSIS — I4891 Unspecified atrial fibrillation: Secondary | ICD-10-CM | POA: Insufficient documentation

## 2021-03-07 DIAGNOSIS — R5383 Other fatigue: Secondary | ICD-10-CM | POA: Insufficient documentation

## 2021-03-07 DIAGNOSIS — N1330 Unspecified hydronephrosis: Secondary | ICD-10-CM | POA: Insufficient documentation

## 2021-03-07 DIAGNOSIS — R590 Localized enlarged lymph nodes: Secondary | ICD-10-CM | POA: Diagnosis not present

## 2021-03-07 DIAGNOSIS — I700 Atherosclerosis of aorta: Secondary | ICD-10-CM | POA: Diagnosis not present

## 2021-03-07 DIAGNOSIS — I10 Essential (primary) hypertension: Secondary | ICD-10-CM | POA: Insufficient documentation

## 2021-03-07 DIAGNOSIS — I6782 Cerebral ischemia: Secondary | ICD-10-CM | POA: Diagnosis not present

## 2021-03-07 DIAGNOSIS — F419 Anxiety disorder, unspecified: Secondary | ICD-10-CM | POA: Diagnosis not present

## 2021-03-07 DIAGNOSIS — R0602 Shortness of breath: Secondary | ICD-10-CM | POA: Insufficient documentation

## 2021-03-07 MED ORDER — ALPRAZOLAM 0.25 MG PO TABS: 0.2500 mg | ORAL_TABLET | Freq: Two times a day (BID) | ORAL | 0 refills | Status: DC | PRN

## 2021-03-07 MED ORDER — ONDANSETRON HCL 8 MG PO TABS: 8.0000 mg | ORAL_TABLET | Freq: Two times a day (BID) | ORAL | 1 refills | Status: AC | PRN

## 2021-03-07 MED ORDER — PROCHLORPERAZINE MALEATE 10 MG PO TABS: 10.0000 mg | ORAL_TABLET | Freq: Four times a day (QID) | ORAL | 1 refills | Status: DC | PRN

## 2021-03-07 NOTE — Progress Notes (Signed)

## 2021-03-07 NOTE — Progress Notes (Signed)
[facility] NEW PATIENT EVALUATION  Name: Brittany Wheeler MRN: [id number]  Date:   03/07/2021           DOB: 01-20-1946  Status: outpatient  PRIMARY SITE & HISTOPATHO0LOGY: Right middle lobe mass 7 x 4.7 x 6 cm bulky right paratracheal adenopathy 6 x 5.3 cm, left AP lymph node 2.8 x 3.1 cm, subcarinal lymph node 3.2 x 2.9 cm, ill-defined difficult to measure right hilar adenopathy, mass invades the right hilum with encasement of the central bronchial structures and abutting against the left atrium and right inferior pulmonary vein, bronchus intermedius significantly encased and narrowed proximally.,  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    HISTORY OF PRESENT ILLNESS:  CHIRSTINA Wheeler is a 75 y.o. female who is known to have a right middle lobe mass by routine outpatient chest x-ray and was scheduled for biopsy.  Patient had progressive symptoms and was seen in the emergency room for worsening shortness of breath with minimal exertion.  She states her symptoms have been progressing for some 2 weeks and she has had it much longer but felt it was related to seasonal allergies.  CT in the ER on 02/27/2021 showed a large bulky central right middle lobe mass measuring up to 7 cm with contralateral and ipsilateral mediastinal lymphadenopathy as well as upper abdominal lymphadenopathy.  There is no evidence bony disease.  Inpatient MRI of the brain on 02/28/2021 was negative for metastatic  disease.  Patient underwent inpatient bronchoscopy on 03/06/2021 and this was aborted due to desaturation.  Bronchial washing diagnoses are pending.  PET/CT pending on 03/13/2021  Patient has no prior environmental exposure.  Patient was a prior smoker 50 pack years quitting some 10 years ago.  Past medical history includes hypertension, HLD, tobacco abuse, hypothyroidism, and clinical anxiety/depression.   PREVIOUS RADIATION THERAPY: No   PAST MEDICAL HISTORY:  has a past medical history of Anxiety, Arthritis, Asthma, Cancer ([place]), Complication of anesthesia, Dyspnea, Dysrhythmia, Hyperlipidemia, Hypertension, Hypothyroidism, PONV (postoperative nausea and vomiting), Pre-diabetes, and Thyroid disease.     PAST SURGICAL HISTORY:  Past Surgical History:  Procedure Laterality Date   COLONOSCOPY     stent placed in kidney     THYROIDECTOMY       FAMILY HISTORY: family history includes Mesothelioma in her father; Rectal cancer in her sister.   SOCIAL HISTORY:  reports that she has quit smoking. Her smoking use included cigarettes. She has never used smokeless tobacco. She reports previous alcohol use. She reports that she does not use drugs.   ALLERGIES: Atorvastatin, Latex, Lovastatin, and Penicillins   MEDICATIONS:  Current Outpatient Medications  Medication Sig Dispense Refill   ALPRAZolam (XANAX) 0.25 MG tablet Take 1 tablet (0.25 mg total) by mouth 2 (two) times daily as needed for anxiety. 60 tablet 0   aspirin 81 MG EC tablet Hold until after your biopsy and after Dr. Humphrey Rolls says ok to resume. 30 tablet 12   Budeson-Glycopyrrol-Formoterol (BREZTRI AEROSPHERE) 160-9-4.8 MCG/ACT AERO Inhale 1 spray into the lungs 2 (two) times daily.  10.7 g 2   diltiazem (CARDIZEM CD) 240 MG 24 hr capsule Take 1 capsule (240 mg total) by mouth daily. To control your heart rate from Afib. 30 capsule 2   ezetimibe (ZETIA) 10 MG tablet TAKE 1 TABLET(10 MG) BY MOUTH EVERY DAY     fluticasone  (FLONASE) 50 MCG/ACT nasal spray Place 1 spray into both nostrils in the morning and at bedtime.     guaiFENesin-dextromethorphan (ROBITUSSIN DM) 100-10 MG/5ML syrup Take 10 mLs by mouth every 6 (six) hours as needed for cough. 118 mL 0   hydrochlorothiazide (HYDRODIURIL) 12.5 MG tablet Take 12.5 mg by mouth daily.     ipratropium (ATROVENT) 0.06 % nasal spray Place 2 sprays into both nostrils 3 (three) times daily.     ipratropium-albuterol (DUONEB) 0.5-2.5 (3) MG/3ML SOLN Take 3 mLs by nebulization every 6 (six) hours as needed. 120 mL 2   levocetirizine (XYZAL) 5 MG tablet Take 5 mg by mouth daily as needed.     levothyroxine (SYNTHROID) 112 MCG tablet Take 112 mcg by mouth daily.     Multiple Vitamin (MULTI-VITAMIN) tablet Take 1 tablet by mouth daily.     ondansetron (ZOFRAN) 8 MG tablet Take 1 tablet (8 mg total) by mouth 2 (two) times daily as needed for refractory nausea / vomiting. 60 tablet 1   PARoxetine (PAXIL) 20 MG tablet Take 20 mg by mouth daily.     prochlorperazine (COMPAZINE) 10 MG tablet Take 1 tablet (10 mg total) by mouth every 6 (six) hours as needed (Nausea or vomiting). 60 tablet 1   No current facility-administered medications for this encounter.     REVIEW OF SYSTEMS:  Pertinent items are noted in HPI.    PHYSICAL EXAM:  vitals were not taken for this visit.   Patient is alert and oriented and answers questions appropriately.  Head is normocephalic without masses.  Neck  In neck supple no lymphadenopathy  Chest lungs show expiratory wheezing.  No intercostal retraction.  Abdomen soft nontender with no hepatosplenomegaly.  Extremities intact x4.  Negative Homans' sign.  No clubbing noted.  Vertebral exam no bony tenderness.   LABORATORY DATA:  Lab Results  Component Value Date   WBC 7.8 03/01/2021   HGB 11.3 (L) 03/01/2021   HCT 34.4 (L) 03/01/2021   MCV 89.8 03/01/2021   PLT 442 (H) 03/01/2021   Lab Results  Component Value Date   NA 130 (L)  03/01/2021   K 3.8 03/01/2021   CL 93 (L) 03/01/2021   CO2 24 03/01/2021   No results found for: ALT, AST, GGT, ALKPHOS, BILITOT   RADIOLOGY:CT CHEST 02/27/2021-Right middle lobe mass 7 x 4.7 x 6 cm bulky right paratracheal adenopathy 6 x 5.3 cm, left AP lymph node 2.8 x 3.1 cm, subcarinal lymph node 3.2 x 2.9 cm, ill-defined difficult to measure right hilar adenopathy, mass invades the right hilum with encasement of the central bronchial structures and abutting against the left atrium and right inferior pulmonary vein, bronchus intermedius significantly encased and narrowed proximally.,  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 and right upper lobe, enlarged upper abdominal gastrohepatic ligament LN measuring 1.6 cm MRI BRAIN 03/01/2021- NO METASTATIC DISEASE  IMPRESSION: Right middle lobe mass 7 x 4.7 x 6 cm Bulky right paratracheal adenopathy 6 x 5.3 cm  Left AP lymph node 2.8 x 3.1 cm  Subcarinal lymph node 3.2 x 2.9 cm ILL-defined difficult to measure right hilar adenopathy Mass invades  the right hilum with encasement of the central bronchial structures and abutting against the left atrium and right inferior pulmonary vein  Bronchus intermedius significantly encased and narrowed proximally 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  She understands that without pathologic diagnosis that treatment could change in the future.  Patient undergo CT simulation on 03/08/2021 at 10:30 AM.  Patient undergo this PET/CT on 03/06/2021.  Patient under go PET CT SIM  fusion after PET is available and treatment will come commence as soon as possible after that.  Plan is approximately 6000 cGy in 30 fractions along with weekly chemotherapy. I spent 40 minutes minutes face to face with the patient and more than 50% of that time was spent in counseling and/or coordination of care.

## 2021-03-08 ENCOUNTER — Ambulatory Visit: Admit: 2021-03-08 | Payer: Medicare Other

## 2021-03-08 ENCOUNTER — Ambulatory Visit
Admission: RE | Admit: 2021-03-08 | Discharge: 2021-03-08 | Disposition: A | Discharge status: Home / Self Care | Payer: Medicare Other | Source: Ambulatory Visit | Attending: Radiation Oncology | Admitting: Radiation Oncology

## 2021-03-08 ENCOUNTER — Other Ambulatory Visit: Payer: Self-pay | Admitting: Oncology

## 2021-03-08 DIAGNOSIS — Z510 Encounter for antineoplastic radiation therapy: Secondary | ICD-10-CM | POA: Diagnosis present

## 2021-03-08 DIAGNOSIS — R918 Other nonspecific abnormal finding of lung field: Secondary | ICD-10-CM | POA: Diagnosis not present

## 2021-03-08 LAB — CYTOLOGY - NON PAP

## 2021-03-08 SURGERY — VIDEO BRONCHOSCOPY WITH ENDOBRONCHIAL NAVIGATION
Anesthesia: General

## 2021-03-08 NOTE — Progress Notes (Signed)
Pharmacist Chemotherapy Monitoring - Initial Assessment    Anticipated start date: 03/15/21   The following has been reviewed per standard work regarding the patient's treatment regimen: The patient's diagnosis, treatment plan and drug doses, and organ/hematologic function Lab orders and baseline tests specific to treatment regimen  The treatment plan start date, drug sequencing, and pre-medications Prior authorization status  Patient's documented medication list, including drug-drug interaction screen and prescriptions for anti-emetics and supportive care specific to the treatment regimen The drug concentrations, fluid compatibility, administration routes, and timing of the medications to be used The patient's access for treatment and lifetime cumulative dose history, if applicable  The patient's medication allergies and previous infusion related reactions, if applicable   Changes made to treatment plan:  N/A  Follow up needed:  [place], [place], 03/08/2021  1:01 PM

## 2021-03-09 ENCOUNTER — Other Ambulatory Visit: Payer: Self-pay

## 2021-03-09 ENCOUNTER — Telehealth: Payer: Self-pay

## 2021-03-09 ENCOUNTER — Inpatient Hospital Stay: Payer: Medicare Other

## 2021-03-09 ENCOUNTER — Telehealth: Payer: Self-pay | Admitting: *Deleted

## 2021-03-09 ENCOUNTER — Telehealth (INDEPENDENT_AMBULATORY_CARE_PROVIDER_SITE_OTHER): Payer: Self-pay

## 2021-03-09 ENCOUNTER — Encounter (INDEPENDENT_AMBULATORY_CARE_PROVIDER_SITE_OTHER): Payer: Self-pay

## 2021-03-09 DIAGNOSIS — C3490 Malignant neoplasm of unspecified part of unspecified bronchus or lung: Secondary | ICD-10-CM

## 2021-03-09 DIAGNOSIS — I4891 Unspecified atrial fibrillation: Secondary | ICD-10-CM

## 2021-03-09 DIAGNOSIS — C342 Malignant neoplasm of middle lobe, bronchus or lung: Secondary | ICD-10-CM

## 2021-03-09 MED ORDER — LIDOCAINE-PRILOCAINE 2.5-2.5 % EX CREA
1.0000 | TOPICAL_CREAM | CUTANEOUS | 0 refills | Status: AC | PRN
Start: 2021-03-09 — End: ?

## 2021-03-09 NOTE — Progress Notes (Signed)
New order entered for Brittany Wheeler to include PT due to mobility issues.

## 2021-03-09 NOTE — Telephone Encounter (Signed)
Prior authorization for EMLA cream submitted via cover my meds (Key: [id number])

## 2021-03-09 NOTE — Telephone Encounter (Signed)
Spoke with pt's friend Brittany Wheeler who requests referral for cardiology to schedule hospital follow up as well as referral to vascular surgery for port placement. EMLA cream sent into pharmacy. Informed Brittany Wheeler that she will be notified with appts once scheduled. Nothing further needed at this time.

## 2021-03-09 NOTE — Telephone Encounter (Signed)
Patient schedule for port insertion 03/13/21 with arrival time 8:15 with Dr Lucky Cowboy. I left a message on patient voicemail to return call back to the office.

## 2021-03-10 ENCOUNTER — Ambulatory Visit: Payer: Medicare Other | Admitting: Cardiology

## 2021-03-10 ENCOUNTER — Encounter: Payer: Self-pay | Admitting: Oncology

## 2021-03-10 ENCOUNTER — Encounter: Payer: Self-pay | Admitting: Cardiology

## 2021-03-10 ENCOUNTER — Ambulatory Visit (INDEPENDENT_AMBULATORY_CARE_PROVIDER_SITE_OTHER): Payer: Medicare Other

## 2021-03-10 VITALS — BP 130/64 | HR 100 | Ht 61.0 in | Wt 149.0 lb

## 2021-03-10 DIAGNOSIS — I480 Paroxysmal atrial fibrillation: Secondary | ICD-10-CM

## 2021-03-10 DIAGNOSIS — C3490 Malignant neoplasm of unspecified part of unspecified bronchus or lung: Secondary | ICD-10-CM

## 2021-03-10 DIAGNOSIS — I10 Essential (primary) hypertension: Secondary | ICD-10-CM

## 2021-03-10 MED ORDER — APIXABAN 5 MG PO TABS: 5.0000 mg | ORAL_TABLET | Freq: Two times a day (BID) | ORAL | 5 refills | Status: AC

## 2021-03-10 NOTE — Patient Instructions (Signed)
Medication Instructions:    START taking Eliquis 5 MG twice a day. (CALL ONCOLOGY to approve start date)  2.    STOP taking your Aspirin when you start your Eliquis.   *If you need a refill on your cardiac medications before your next appointment, please call your pharmacy*   Lab Work: None ordered  If you have labs (blood work) drawn today and your tests are completely normal, you will receive your results only by: [place] (if you have MyChart) OR A paper copy in the mail If you have any lab test that is abnormal or we need to change your treatment, we will call you to review the results.   Testing/Procedures:  Your physician has requested that you have an echocardiogram. Echocardiography is a painless test that uses sound waves to create images of your heart. It provides your doctor with information about the size and shape of your heart and how well your heart's chambers and valves are working. This procedure takes approximately one hour. There are no restrictions for this procedure.  2.   Your physician has recommended that you wear a Zio monitor, we will mail this to you to apply after your port-a-cath procedure.   This monitor is a medical device that records the heart's electrical activity. Doctors most often use these monitors to diagnose arrhythmias. Arrhythmias are problems with the speed or rhythm of the heartbeat. The monitor is a small device applied to your chest. You can wear one while you do your normal daily activities. While wearing this monitor if you have any symptoms to push the button and record what you felt. Once you have worn this monitor for the period of time provider prescribed (Usually 14 days), you will return the monitor device in the postage paid box. Once it is returned they will download the data collected and provide [place] with a report which the provider will then review and we will call you with those results. Important tips:  Avoid showering  during the first 24 hours of wearing the monitor. Avoid excessive sweating to help maximize wear time. Do not submerge the device, no hot tubs, and no swimming pools. Keep any lotions or oils away from the patch. After 24 hours you may shower with the patch on. Take brief showers with your back facing the shower head.  Do not remove patch once it has been placed because that will interrupt data and decrease adhesive wear time. Push the button when you have any symptoms and write down what you were feeling. Once you have completed wearing your monitor, remove and place into box which has postage paid and place in your outgoing mailbox.  If for some reason you have misplaced your box then call our office and we can provide another box and/or mail it off for you.     Follow-Up: At [facility], you and your health needs are our priority.  As part of our continuing mission to provide you with exceptional heart care, we have created designated Provider Care Teams.  These Care Teams include your primary Cardiologist (physician) and Advanced Practice Providers (APPs -  Physician Assistants and Nurse Practitioners) who all work together to provide you with the care you need, when you need it.  We recommend signing up for the patient portal called "MyChart".  Sign up information is provided on this After Visit Summary.  MyChart is used to connect with patients for Virtual Visits (Telemedicine).  Patients are able to view lab/test  results, encounter notes, upcoming appointments, etc.  Non-urgent messages can be sent to your provider as well.   To learn more about what you can do with MyChart, go to [email or web address].    Your next appointment:   2-3 month(s)  The format for your next appointment:   In Person  Provider:   [name], MD   Other Instructions

## 2021-03-10 NOTE — Progress Notes (Signed)
Cardiology Office Note:    Date:  03/10/2021   ID:  Brittany Wheeler, DOB 1945/10/14, MRN [id number]  PCP:  [name], MD   [facility] Cardiologist:  [name], MD     Referring MD: [name], MD   Chief Complaint  Patient presents with   New Patient (Initial Visit)    Urgent referral for Afib. Patient c.o wheezing and Afib. Meds reviewed verbally with patient.     History of Present Illness:    Brittany Wheeler is a 75 y.o. female with a hx of paroxysmal atrial fibrillation, hypertension, hyperlipidemia, former smoker x30+ years who presents due to atrial fibrillation.  Patient recently admitted to the hospital due to shortness of breath and cough.  Chest x-ray showed a right lung mass.  Hospitalization complicated by A. fib with RVR, managed with Cardizem, eventually discharged on p.o. Cardizem.  Patient not started on anticoagulation due to bronchoscopy and possible biopsy being planned.  Underwent bronchoscopy with biopsy on 03/06/2021.  Cytology results indicate non-small cell carcinoma.  Also followed up with oncology, Port-A-Cath placement is planned in 3 days, for possible chemotherapy initiation.  She feels well, denies palpitations, dizziness, has occasional cough with shortness of breath.  Past Medical History:  Diagnosis Date   Anxiety    Arthritis    Asthma    Cancer ([place])    benign mass removed   Complication of anesthesia    Dyspnea    Dysrhythmia    Hyperlipidemia    Hypertension    Hypothyroidism    PONV (postoperative nausea and vomiting)    Pre-diabetes    Thyroid disease     Past Surgical History:  Procedure Laterality Date   COLONOSCOPY     FLEXIBLE BRONCHOSCOPY Bilateral 03/06/2021   Procedure: FLEXIBLE BRONCHOSCOPY;  Surgeon: [name], MD;  Location: [facility] ORS;  Service: Pulmonary;  Laterality: Bilateral;   stent placed in kidney     THYROIDECTOMY      Current Medications: Current Meds  Medication  Sig   ALPRAZolam (XANAX) 0.25 MG tablet Take 1 tablet (0.25 mg total) by mouth 2 (two) times daily as needed for anxiety.   apixaban (ELIQUIS) 5 MG TABS tablet Take 1 tablet (5 mg total) by mouth 2 (two) times daily.   Budeson-Glycopyrrol-Formoterol (BREZTRI AEROSPHERE) 160-9-4.8 MCG/ACT AERO Inhale 1 spray into the lungs 2 (two) times daily.   diltiazem (CARDIZEM CD) 240 MG 24 hr capsule Take 1 capsule (240 mg total) by mouth daily. To control your heart rate from Afib.   fluticasone (FLONASE) 50 MCG/ACT nasal spray Place 1 spray into both nostrils in the morning and at bedtime.   guaiFENesin-dextromethorphan (ROBITUSSIN DM) 100-10 MG/5ML syrup Take 10 mLs by mouth every 6 (six) hours as needed for cough.   hydrochlorothiazide (HYDRODIURIL) 12.5 MG tablet Take 12.5 mg by mouth daily.   ipratropium (ATROVENT) 0.06 % nasal spray Place 2 sprays into both nostrils 3 (three) times daily.   ipratropium-albuterol (DUONEB) 0.5-2.5 (3) MG/3ML SOLN Take 3 mLs by nebulization every 6 (six) hours as needed.   levocetirizine (XYZAL) 5 MG tablet Take 5 mg by mouth daily as needed.   levothyroxine (SYNTHROID) 112 MCG tablet Take 112 mcg by mouth daily.   lidocaine-prilocaine (EMLA) cream Apply 1 application topically as needed.   Multiple Vitamin (MULTI-VITAMIN) tablet Take 1 tablet by mouth daily.   ondansetron (ZOFRAN) 8 MG tablet Take 1 tablet (8 mg total) by mouth 2 (two) times daily as needed for  refractory nausea / vomiting.   PARoxetine (PAXIL) 20 MG tablet Take 20 mg by mouth daily.   prochlorperazine (COMPAZINE) 10 MG tablet Take 1 tablet (10 mg total) by mouth every 6 (six) hours as needed (Nausea or vomiting).     Allergies:   Atorvastatin, Latex, Lovastatin, and Penicillins   Social History   Socioeconomic History   Marital status: Widowed    Spouse name: Not on file   Number of children: Not on file   Years of education: Not on file   Highest education level: Not on file  Occupational  History   Not on file  Tobacco Use   Smoking status: Former    Types: Cigarettes   Smokeless tobacco: Never  Substance and Sexual Activity   Alcohol use: Not Currently   Drug use: Never   Sexual activity: Not on file  Other Topics Concern   Not on file  Social History Narrative   Lives alone in home, cousin will be with patient during this time of need   Social Determinants of Health   Financial Resource Strain: Not on file  Food Insecurity: Not on file  Transportation Needs: Not on file  Physical Activity: Not on file  Stress: Not on file  Social Connections: Not on file     Family History: The patient's family history includes Mesothelioma in her father; Rectal cancer in her sister.  ROS:   Please see the history of present illness.     All other systems reviewed and are negative.  EKGs/Labs/Other Studies Reviewed:    The following studies were reviewed today:   EKG:  EKG is  ordered today.  The ekg ordered today demonstrates sinus rhythm, PACs  Recent Labs: 02/27/2021: B Natriuretic Peptide 52.0 03/01/2021: BUN 17; Creatinine, Ser 1.00; Hemoglobin 11.3; Magnesium 2.0; Platelets 442; Potassium 3.8; Sodium 130  Recent Lipid Panel No results found for: CHOL, TRIG, HDL, CHOLHDL, VLDL, LDLCALC, LDLDIRECT   Risk Assessment/Calculations:          Physical Exam:    VS:  BP 130/64 (BP Location: Left Arm, Patient Position: Sitting, Cuff Size: Normal)   Pulse 100   Ht _0  (1.549 m)   Wt 149 lb (67.6 kg)   SpO2 94%   BMI 28.15 kg/m     Wt Readings from Last 3 Encounters:  03/10/21 149 lb (67.6 kg)  03/07/21 152 lb 3.2 oz (69 kg)  03/06/21 149 lb 14.6 oz (68 kg)     GEN:  Well nourished, well developed in no acute distress HEENT: Normal NECK: No JVD; No carotid bruits LYMPHATICS: No lymphadenopathy CARDIAC: RRR, no murmurs, rubs, gallops RESPIRATORY: Clear anteriorly, diminished at bases ABDOMEN: Soft, non-tender, non-distended MUSCULOSKELETAL:  No  edema; No deformity  SKIN: Warm and dry NEUROLOGIC:  Alert and oriented x 3 PSYCHIATRIC:  Normal affect   ASSESSMENT:    1. Paroxysmal atrial fibrillation (HCC)   2. Primary hypertension   3. Malignant neoplasm of lung, unspecified laterality, unspecified part of lung (HCC)    PLAN:    In order of problems listed above:  Paroxysmal atrial fibrillation, CHA2DS2-VASc score 3 (Age, htn, Gender).  Currently in sinus rhythm.  Heart rate controlled.  Continue Cardizem 240 mg daily, start Eliquis 5 mg twice daily if okay with oncology.  Get echocardiogram.  Okay to stop aspirin after starting Eliquis.  Placed 2-week cardiac monitor to document A. fib burden Hypertension, BP controlled.  Cardizem, HCTZ continue. Lung cancer, management as per oncology.  Follow-up in 3 months.     Medication Adjustments/Labs and Tests Ordered: Current medicines are reviewed at length with the patient today.  Concerns regarding medicines are outlined above.  Orders Placed This Encounter  Procedures   LONG TERM MONITOR (3-14 DAYS)   EKG 12-Lead   ECHOCARDIOGRAM COMPLETE    Meds ordered this encounter  Medications   apixaban (ELIQUIS) 5 MG TABS tablet    Sig: Take 1 tablet (5 mg total) by mouth 2 (two) times daily.    Dispense:  60 tablet    Refill:  5     Patient Instructions  Medication Instructions:    START taking Eliquis 5 MG twice a day. (CALL ONCOLOGY to approve start date)  2.    STOP taking your Aspirin when you start your Eliquis.   *If you need a refill on your cardiac medications before your next appointment, please call your pharmacy*   Lab Work: None ordered  If you have labs (blood work) drawn today and your tests are completely normal, you will receive your results only by: [facility] (if you have MyChart) OR A paper copy in the mail If you have any lab test that is abnormal or we need to change your treatment, we will call you to review the  results.   Testing/Procedures:  Your physician has requested that you have an echocardiogram. Echocardiography is a painless test that uses sound waves to create images of your heart. It provides your doctor with information about the size and shape of your heart and how well your heart's chambers and valves are working. This procedure takes approximately one hour. There are no restrictions for this procedure.  2.   Your physician has recommended that you wear a Zio monitor, we will mail this to you to apply after your port-a-cath procedure.   This monitor is a medical device that records the heart's electrical activity. Doctors most often use these monitors to diagnose arrhythmias. Arrhythmias are problems with the speed or rhythm of the heartbeat. The monitor is a small device applied to your chest. You can wear one while you do your normal daily activities. While wearing this monitor if you have any symptoms to push the button and record what you felt. Once you have worn this monitor for the period of time provider prescribed (Usually 14 days), you will return the monitor device in the postage paid box. Once it is returned they will download the data collected and provide [place] with a report which the provider will then review and we will call you with those results. Important tips:  Avoid showering during the first 24 hours of wearing the monitor. Avoid excessive sweating to help maximize wear time. Do not submerge the device, no hot tubs, and no swimming pools. Keep any lotions or oils away from the patch. After 24 hours you may shower with the patch on. Take brief showers with your back facing the shower head.  Do not remove patch once it has been placed because that will interrupt data and decrease adhesive wear time. Push the button when you have any symptoms and write down what you were feeling. Once you have completed wearing your monitor, remove and place into box which has postage paid and  place in your outgoing mailbox.  If for some reason you have misplaced your box then call our office and we can provide another box and/or mail it off for you.     Follow-Up: At [facility], you  and your health needs are our priority.  As part of our continuing mission to provide you with exceptional heart care, we have created designated Provider Care Teams.  These Care Teams include your primary Cardiologist (physician) and Advanced Practice Providers (APPs -  Physician Assistants and Nurse Practitioners) who all work together to provide you with the care you need, when you need it.  We recommend signing up for the patient portal called "MyChart".  Sign up information is provided on this After Visit Summary.  MyChart is used to connect with patients for Virtual Visits (Telemedicine).  Patients are able to view lab/test results, encounter notes, upcoming appointments, etc.  Non-urgent messages can be sent to your provider as well.   To learn more about what you can do with MyChart, go to [email or web address].    Your next appointment:   2-3 month(s)  The format for your next appointment:   In Person  Provider:   [name], MD   Other Instructions    Signed, [name], MD  03/10/2021 12:25 PM    [place]

## 2021-03-10 NOTE — Telephone Encounter (Signed)
Patient POA returned call back to the office, she was made aware with pre-procedure instructions,date,and arrival time.

## 2021-03-10 NOTE — Telephone Encounter (Signed)
PA approved, pharmacy notified.

## 2021-03-10 NOTE — Progress Notes (Signed)
[place]  [phone] [phone]) [phone]  ID: Oneita Kras OB: July 18, 1946  MR#: [id number]  GGY#:[id number]  Patient Care Team: [name], MD as PCP - General (Family Medicine) [name], MD as PCP - Cardiology (Cardiology) [name], RN as Oncology Nurse Navigator  CHIEF COMPLAINT:Stage IIIb lung cancer.  INTERVAL HISTORY: Patient returns to clinic today for further evaluation, discussion of her imaging results, and consideration of cycle 1 of weekly carboplatin and Taxol.  She will start concurrent XRT tomorrow.  She continues to have a decreased performance status as well as increased weakness and fatigue.  She has chronic shortness of breath and cough that are unchanged. She does not complain of pain.  She has no neurologic complaints.  She denies any recent fevers.  She has a fair appetite, but denies weight loss.  She has no chest pain or hemoptysis.  She has no nausea, vomiting, constipation, or diarrhea.  She has no urinary complaints.  Patient offers no further specific complaints today.  REVIEW OF SYSTEMS:   Review of Systems  Constitutional:  Positive for malaise/fatigue. Negative for fever and weight loss.  Respiratory:  Positive for cough and shortness of breath. Negative for hemoptysis.   Cardiovascular: Negative.  Negative for chest pain and leg swelling.  Gastrointestinal: Negative.  Negative for abdominal pain and nausea.  Genitourinary: Negative.  Negative for dysuria.  Musculoskeletal: Negative.  Negative for back pain.  Skin: Negative.  Negative for itching and rash.  Neurological:  Positive for weakness. Negative for dizziness, focal weakness and headaches.  Psychiatric/Behavioral: Negative.  The patient is not nervous/anxious.    As per HPI. Otherwise, a complete review of systems is negative.  PAST MEDICAL HISTORY: Past Medical History:  Diagnosis Date   Anxiety    Arthritis    Asthma    Cancer ([place])     benign mass removed   Complication of anesthesia    Dyspnea    Dysrhythmia    Hyperlipidemia    Hypertension    Hypothyroidism    PONV (postoperative nausea and vomiting)    Pre-diabetes    Thyroid disease     PAST SURGICAL HISTORY: Past Surgical History:  Procedure Laterality Date   COLONOSCOPY     FLEXIBLE BRONCHOSCOPY Bilateral 03/06/2021   Procedure: FLEXIBLE BRONCHOSCOPY;  Surgeon: [name], MD;  Location: [facility];  Service: Pulmonary;  Laterality: Bilateral;   PORTA CATH INSERTION N/A 03/13/2021   Procedure: PORTA CATH INSERTION;  Surgeon: [name], MD;  Location: [facility];  Service: Cardiovascular;  Laterality: N/A;   stent placed in kidney     THYROIDECTOMY      FAMILY HISTORY: Family History  Problem Relation Age of Onset   Mesothelioma Father    Rectal cancer Sister     ADVANCED DIRECTIVES (Y/N):  N  HEALTH MAINTENANCE: Social History   Tobacco Use   Smoking status: Former    Types: Cigarettes    Quit date: 2012    Years since quitting: 10.6   Smokeless tobacco: Never  Vaping Use   Vaping Use: Never used  Substance Use Topics   Alcohol use: Not Currently   Drug use: Never     Colonoscopy:  PAP:  Bone density:  Lipid panel:  Allergies  Allergen Reactions   Atorvastatin Other (See Comments)    Joint pain   Latex    Lovastatin Other (See Comments)    Arthritis pain in hands   Penicillins  Current Outpatient Medications  Medication Sig Dispense Refill   ALPRAZolam (XANAX) 0.25 MG tablet Take 1 tablet (0.25 mg total) by mouth 2 (two) times daily as needed for anxiety. 60 tablet 0   Budeson-Glycopyrrol-Formoterol (BREZTRI AEROSPHERE) 160-9-4.8 MCG/ACT AERO Inhale 1 spray into the lungs 2 (two) times daily. 10.7 g 2   diltiazem (CARDIZEM CD) 240 MG 24 hr capsule Take 1 capsule (240 mg total) by mouth daily. To control your heart rate from Afib. 30 capsule 2   fluticasone (FLONASE) 50 MCG/ACT nasal spray Place 1 spray  into both nostrils in the morning and at bedtime.     guaiFENesin-dextromethorphan (ROBITUSSIN DM) 100-10 MG/5ML syrup Take 10 mLs by mouth every 6 (six) hours as needed for cough. 118 mL 0   hydrochlorothiazide (HYDRODIURIL) 12.5 MG tablet Take 12.5 mg by mouth daily.     ipratropium (ATROVENT) 0.06 % nasal spray Place 2 sprays into both nostrils 3 (three) times daily.     ipratropium-albuterol (DUONEB) 0.5-2.5 (3) MG/3ML SOLN Take 3 mLs by nebulization every 6 (six) hours as needed. 120 mL 2   levocetirizine (XYZAL) 5 MG tablet Take 5 mg by mouth daily as needed.     levothyroxine (SYNTHROID) 112 MCG tablet Take 112 mcg by mouth daily.     lidocaine-prilocaine (EMLA) cream Apply 1 application topically as needed. 30 g 0   Multiple Vitamin (MULTI-VITAMIN) tablet Take 1 tablet by mouth daily.     PARoxetine (PAXIL) 20 MG tablet Take 30 mg by mouth daily.     apixaban (ELIQUIS) 5 MG TABS tablet Take 1 tablet (5 mg total) by mouth 2 (two) times daily. (Patient not taking: Reported on 03/15/2021) 60 tablet 5   ezetimibe (ZETIA) 10 MG tablet TAKE 1 TABLET(10 MG) BY MOUTH EVERY DAY (Patient not taking: No sig reported)     ondansetron (ZOFRAN) 8 MG tablet Take 1 tablet (8 mg total) by mouth 2 (two) times daily as needed for refractory nausea / vomiting. (Patient not taking: No sig reported) 60 tablet 1   prochlorperazine (COMPAZINE) 10 MG tablet TAKE 1 TABLET BY MOUTH EVERY 6 HOURS AS NEEDED FOR NAUSEA OR VOMITING 60 tablet 1   No current facility-administered medications for this visit.    OBJECTIVE: Vitals:   03/15/21 0851  BP: (!) 88/60  Pulse: 90  Resp: 16  Temp: (!) 96 F (35.6 C)  SpO2: 95%     Body mass index is 29.48 kg/m.    ECOG FS:2 - Symptomatic, <50% confined to bed  General: Well-developed, well-nourished, no acute distress.  Sitting in a wheelchair. Eyes: Pink conjunctiva, anicteric sclera. HEENT: Normocephalic, moist mucous membranes. Lungs: No audible wheezing or  coughing. Heart: Regular rate and rhythm. Abdomen: Soft, nontender, no obvious distention. Musculoskeletal: No edema, cyanosis, or clubbing. Neuro: Alert, answering all questions appropriately. Cranial nerves grossly intact. Skin: No rashes or petechiae noted. Psych: Normal affect.   LAB RESULTS:  Lab Results  Component Value Date   NA 131 (L) 03/15/2021   K 3.9 03/15/2021   CL 97 (L) 03/15/2021   CO2 26 03/15/2021   GLUCOSE 145 (H) 03/15/2021   BUN 27 (H) 03/15/2021   CREATININE 1.38 (H) 03/15/2021   CALCIUM 11.0 (H) 03/15/2021   PROT 7.3 03/15/2021   ALBUMIN 3.5 03/15/2021   AST 15 03/15/2021   ALT 12 03/15/2021   ALKPHOS 57 03/15/2021   BILITOT 0.8 03/15/2021   GFRNONAA 40 (L) 03/15/2021    Lab Results  Component Value Date  WBC 14.9 (H) 03/15/2021   NEUTROABS 12.9 (H) 03/15/2021   HGB 12.1 03/15/2021   HCT 37.1 03/15/2021   MCV 90.3 03/15/2021   PLT 376 03/15/2021     STUDIES: CT Angio Chest PE W and/or Wo Contrast  Result Date: 02/27/2021 CLINICAL DATA:  Chest pain and shortness of breath, wheezing EXAM: CT ANGIOGRAPHY CHEST WITH CONTRAST TECHNIQUE: Multidetector CT imaging of the chest was performed using the standard protocol during bolus administration of intravenous contrast. Multiplanar CT image reconstructions and MIPs were obtained to evaluate the vascular anatomy. CONTRAST:  33m OMNIPAQUE IOHEXOL 350 MG/ML SOLN COMPARISON:  None. FINDINGS: Cardiovascular: Thoracic aortic atherosclerosis without acute aneurysm or dissection. Patent 3 vessel arch anatomy. Pulmonary arteries appear patent without acute filling defect or pulmonary embolus. Segmental pulmonary branches to the right middle lobe are compressed by the right middle lobe mass described below. Normal heart size. Native coronary atherosclerosis noted. No pericardial effusion. Mediastinum/Nodes: Bulky right paratracheal adenopathy measures 6 x 5.3 cm. Enlarged central left AP window lymph node measures  2.8 x 3.1 cm. Subcarinal adenopathy measures 3.2 x 2.9 cm. These are compatible with mediastinal metastatic adenopathy. Suspect additional ill-defined and difficult to measure right hilar adenopathy. Lungs/Pleura: Large central bulky right middle lobe mass measures approximately 7 x 4.7 x 6 cm. Mass does appear to invade into the right hilum with encasement of the central bronchial structures and abutting against the left atrium and right inferior pulmonary vein. 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 There is complete collapse or invasion of the right middle lobe bronchi. Bronchus intermedius also significantly encased and narrowed proximally. Central right lung surrounding interstitial micro nodularity suggesting right lung regional lymphangitic tumor spread in the right middle lobe and right upper lobe. Mild nonspecific right upper lobe patchy peribronchovascular airspace opacities. Left lung remains clear. Small non loculated right pleural effusion with associated right basilar subsegmental atelectasis. Upper Abdomen: Enlarged upper abdomen gastric hepatic ligament lymph node measures 1.6 cm, image 82/4. No acute upper abdominal finding. Abdominal atherosclerosis noted. Musculoskeletal: Degenerative changes of the spine. No acute osseous finding. No osseous lytic or blastic disease. Sternum intact. Review of the MIP images confirms the above findings. IMPRESSION: Large bulky central right middle lobe mass measuring up to 7 cm compatible with a primary bronchogenic carcinoma with evidence of bulky metastatic mediastinal adenopathy and suspect surrounding right middle lobe and right upper lobe lymphangitic tumor spread. Also enlarged upper abdominal gastrohepatic lymph nodes suspicious for metastatic disease. Associated trace right pleural effusion and right basilar atelectasis. Negative for significant acute pulmonary embolus. Aortic Atherosclerosis (ICD10-I70.0). These results were called by  telephone at the time of interpretation on 02/27/2021 at 11:28 am to provider Dr. EAlwyn Ren who verbally acknowledged these results. Electronically Signed   By: MJerilynn Mages  Shick M.D.   On: 02/27/2021 11:30   MR BRAIN W WO CONTRAST  Result Date: 02/28/2021 CLINICAL DATA:  Lung cancer, staging EXAM: MRI HEAD WITHOUT AND WITH CONTRAST TECHNIQUE: Multiplanar, multiecho pulse sequences of the brain and surrounding structures were obtained without and with intravenous contrast. CONTRAST:  629mGADAVIST GADOBUTROL 1 MMOL/ML IV SOLN COMPARISON:  None. FINDINGS: Brain: There is no acute infarction or intracranial hemorrhage. There is no intracranial mass, mass effect, or edema. There is no hydrocephalus or extra-axial fluid collection. Ventricles and sulci are within normal limits in size and configuration. Patchy and confluent areas of T2 hyperintensity in the supratentorial and pontine white matter are nonspecific may reflect mild to moderate chronic microvascular  ischemic changes. No abnormal enhancement. Vascular: Major vessel flow voids at the skull base are preserved. Skull and upper cervical spine: Normal marrow signal is preserved. Sinuses/Orbits: Paranasal sinuses are aerated. Orbits are unremarkable. Other: Sella is unremarkable.  Mastoid air cells are clear. IMPRESSION: No evidence of intracranial metastatic disease. Chronic microvascular ischemic changes. Electronically Signed   By: Macy Mis M.D.   On: 02/28/2021 20:57   PERIPHERAL VASCULAR CATHETERIZATION  Result Date: 03/13/2021 See surgical note for result.  NM PET Image Initial (PI) Skull Base To Thigh  Result Date: 03/14/2021 CLINICAL DATA:  Initial treatment strategy for right lung mass and adenopathy. EXAM: NUCLEAR MEDICINE PET SKULL BASE TO THIGH TECHNIQUE: 8.1 mCi F-18 FDG was injected intravenously. Full-ring PET imaging was performed from the skull base to thigh after the radiotracer. CT data was obtained and used for attenuation correction  and anatomic localization. Fasting blood glucose: 105 mg/dl COMPARISON:  Chest CT 02/27/2021 FINDINGS: Mediastinal blood pool activity: SUV max 2.51 Liver activity: SUV max NA NECK: No hypermetabolic lymph nodes in the neck. Incidental CT findings: none CHEST: Large right middle lobe lung mass is hypermetabolic with SUV max of 68.11. This is invading the right hilum and mediastinum. Extensive right paratracheal adenopathy with SUV max of 24.41. There is also contralateral AP window adenopathy. Subcarinal adenopathy has an SUV max of 17.69. Small right pleural effusion without hypermetabolic pleural nodules. Few patchy E ill-defined sub solid nodular opacities appear relatively stable. No definite hypermetabolism but recommend CT surveillance. There are several small bilateral supraclavicular nodes which are weakly hypermetabolic. SUV max ranging between 2.5 and 2.6. Incidental CT findings: Vascular calcifications. ABDOMEN/PELVIS: No abnormal hypermetabolic activity within the liver, pancreas, adrenal glands, or spleen. No hypermetabolic lymph nodes in the abdomen or pelvis. Incidental CT findings: Left pelvic kidney is noted with moderate hydronephrosis. SKELETON: No findings suspicious for osseous metastatic disease. Incidental CT findings: none IMPRESSION: 1. Large right middle lobe lung mass is hypermetabolic and consistent with neoplasm. 2. Extensive invasion/involvement of the right hilum and mediastinum. 3. Patchy ground-glass nodules in both lungs will require CT surveillance. 4. Small bilateral supraclavicular nodes are weakly hypermetabolic and suspicious for metastatic adenopathy. 5. No findings to suggest abdominal/pelvic metastatic disease or osseous metastatic disease. Electronically Signed   By: Marijo Sanes M.D.   On: 03/14/2021 15:48   ECHOCARDIOGRAM COMPLETE  Result Date: 03/15/2021    ECHOCARDIOGRAM REPORT   Patient Name:   HASSET CHAVIANO Date of Exam: 03/14/2021 Medical Rec #:  [id number]         Height:       61.0 in Accession #:    [id number]       Weight:       149.0 lb Date of Birth:  [birth year]        BSA:          1.667 m Patient Age:    75 years         BP:           130/64 mmHg Patient Gender: F                HR:           95 bpm. Exam Location:  [facility] Procedure: 2D Echo, Color Doppler, Cardiac Doppler and Intracardiac            Opacification Agent Indications:    I48.91 Atrial fibrillation  History:        Patient has no prior history of  Echocardiogram examinations.                 Risk Factors:Hypertension, Dyslipidemia and Former Smoker.  Sonographer:    Charmayne Sheer Referring Phys: [id number] BRIAN AGBOR-ETANG  Sonographer Comments: Technically difficult study due to poor echo windows. Image acquisition challenging due to patient body habitus. IMPRESSIONS  1. Left ventricular ejection fraction, by estimation, is >55%. The left ventricle has normal function. Left ventricular endocardial border not optimally defined to evaluate regional wall motion. There is mild left ventricular hypertrophy. Left ventricular diastolic parameters are consistent with Grade I diastolic dysfunction (impaired relaxation).  2. Right ventricular systolic function is normal. The right ventricular size is normal. Mildly increased right ventricular wall thickness.  3. The mitral valve was not well visualized. No evidence of mitral valve regurgitation. No evidence of mitral stenosis.  4. Tricuspid valve regurgitation is mild to moderate.  5. The aortic valve was not well visualized. Aortic valve regurgitation is not visualized. No aortic stenosis is present.  6. The inferior vena cava is normal in size with greater than 50% respiratory variability, suggesting right atrial pressure of 3 mmHg. FINDINGS  Left Ventricle: Left ventricular ejection fraction, by estimation, is >55%. The left ventricle has normal function. Left ventricular endocardial border not optimally defined to evaluate regional wall motion. Definity  contrast agent was given IV to delineate the left ventricular endocardial borders. The left ventricular internal cavity size was normal in size. There is mild left ventricular hypertrophy. Left ventricular diastolic parameters are consistent with Grade I diastolic dysfunction (impaired relaxation). Right Ventricle: The right ventricular size is normal. Mildly increased right ventricular wall thickness. Right ventricular systolic function is normal. Left Atrium: Left atrial size was normal in size. Right Atrium: Right atrial size was not well visualized. Pericardium: Trivial pericardial effusion is present. Mitral Valve: The mitral valve was not well visualized. Mild mitral annular calcification. No evidence of mitral valve regurgitation. No evidence of mitral valve stenosis. MV peak gradient, 2.4 mmHg. The mean mitral valve gradient is 1.0 mmHg. Tricuspid Valve: The tricuspid valve is not well visualized. Tricuspid valve regurgitation is mild to moderate. Aortic Valve: The aortic valve was not well visualized. Aortic valve regurgitation is not visualized. No aortic stenosis is present. Aortic valve mean gradient measures 3.0 mmHg. Aortic valve peak gradient measures 6.2 mmHg. Aortic valve area, by VTI measures 2.10 cm. Pulmonic Valve: The pulmonic valve was not well visualized. Pulmonic valve regurgitation is not visualized. No evidence of pulmonic stenosis. Aorta: The aortic root is normal in size and structure. Pulmonary Artery: The pulmonary artery is not well seen. Venous: The inferior vena cava is normal in size with greater than 50% respiratory variability, suggesting right atrial pressure of 3 mmHg. IAS/Shunts: The interatrial septum was not well visualized.  LEFT VENTRICLE PLAX 2D LVIDd:         3.15 cm  Diastology LVIDs:         2.20 cm  LV e' medial:    5.33 cm/s LV PW:         1.10 cm  LV E/e' medial:  8.7 LV IVS:        0.95 cm  LV e' lateral:   6.53 cm/s LVOT diam:     1.90 cm  LV E/e' lateral: 7.1 LV  SV:         39 LV SV Index:   24 LVOT Area:     2.84 cm  RIGHT VENTRICLE RV Basal diam:  3.00 cm  LEFT ATRIUM           Index LA diam:      2.40 cm 1.44 cm/m LA Vol (A4C): 11.0 ml 6.60 ml/m  AORTIC VALVE                   PULMONIC VALVE AV Area (Vmax):    2.10 cm    PV Vmax:       0.94 m/s AV Area (Vmean):   2.03 cm    PV Vmean:      64.600 cm/s AV Area (VTI):     2.10 cm    PV VTI:        0.145 m AV Vmax:           124.00 cm/s PV Peak grad:  3.5 mmHg AV Vmean:          81.700 cm/s PV Mean grad:  2.0 mmHg AV VTI:            0.188 m AV Peak Grad:      6.2 mmHg AV Mean Grad:      3.0 mmHg LVOT Vmax:         92.00 cm/s LVOT Vmean:        58.600 cm/s LVOT VTI:          0.139 m LVOT/AV VTI ratio: 0.74  AORTA Ao Root diam: 2.70 cm MITRAL VALVE MV Area (PHT): 6.07 cm    SHUNTS MV Area VTI:   3.66 cm    Systemic VTI:  0.14 m MV Peak grad:  2.4 mmHg    Systemic Diam: 1.90 cm MV Mean grad:  1.0 mmHg MV Vmax:       0.78 m/s MV Vmean:      45.4 cm/s MV Decel Time: 125 msec MV E velocity: 46.30 cm/s MV A velocity: 84.80 cm/s MV E/A ratio:  0.55 Harrell Gave End MD Electronically signed by Nelva Bush MD Signature Date/Time: 03/15/2021/1:02:31 PM    Final     ASSESSMENT: Stage IIIb lung cancer.  PLAN:    Stage IIIb lung cancer: CT scan results from February 27, 2021 reviewed independently and reported as above with bulky 7 cm right middle lobe mass invading right hilum with bulky mediastinal lymphadenopathy highly suspicious for underlying bronchogenic malignancy.  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  MRI of the brain on February 28, 2021 reviewed independently with no obvious metastatic disease.  PET scan on March 14, 2021 confirmed staging with no obvious evidence of metastatic disease.  Patient has now had port placement.  Proceed with cycle 1 of weekly carboplatinum and Taxol today.  Daily XRT will start tomorrow.  Plan to do 6-8 cycles and then transition to maintenance  durvalumab for a year.  Return to clinic in 1 week for further evaluation and consideration of cycle 2.   Atrial fibrillation: Continue Cardizem and Eliquis.  Patient also will have a Holter monitor placed for the next several weeks.  Appreciate cardiology input. Anxiety: Continue Xanax as prescribed. Shortness of breath, declining performance status: Patient was given home health as well as palliative care referrals.   Patient expressed understanding and was in agreement with this plan. She also understands that She can call clinic at any time with any questions, concerns, or complaints.   Cancer Staging Primary cancer of right middle lobe of lung [facility]) Staging form: Lung, AJCC 8th Edition - Clinical stage from 03/07/2021: Stage IIIB (cT4, cN2, cM0) - Signed by [name],  MD on 03/07/2021 Stage prefix: Initial diagnosis  [name], MD   03/16/2021 6:24 AM

## 2021-03-13 ENCOUNTER — Ambulatory Visit
Admission: RE | Admit: 2021-03-13 | Discharge: 2021-03-13 | Disposition: A | Discharge status: Home / Self Care | Payer: Medicare Other | Attending: Vascular Surgery | Admitting: Vascular Surgery

## 2021-03-13 ENCOUNTER — Encounter: Payer: Self-pay | Admitting: Vascular Surgery

## 2021-03-13 ENCOUNTER — Other Ambulatory Visit (INDEPENDENT_AMBULATORY_CARE_PROVIDER_SITE_OTHER): Payer: Self-pay | Admitting: Nurse Practitioner

## 2021-03-13 ENCOUNTER — Ambulatory Visit
Admission: RE | Admit: 2021-03-13 | Discharge: 2021-03-13 | Disposition: A | Discharge status: Home / Self Care | Payer: Medicare Other | Source: Ambulatory Visit | Attending: Oncology | Admitting: Oncology

## 2021-03-13 ENCOUNTER — Other Ambulatory Visit: Payer: Self-pay

## 2021-03-13 ENCOUNTER — Encounter
Admission: RE | Disposition: A | Discharge status: Home / Self Care | Payer: Self-pay | Source: Home / Self Care | Attending: Vascular Surgery

## 2021-03-13 DIAGNOSIS — Z79890 Hormone replacement therapy (postmenopausal): Secondary | ICD-10-CM | POA: Insufficient documentation

## 2021-03-13 DIAGNOSIS — Z79899 Other long term (current) drug therapy: Secondary | ICD-10-CM | POA: Insufficient documentation

## 2021-03-13 DIAGNOSIS — Z888 Allergy status to other drugs, medicaments and biological substances status: Secondary | ICD-10-CM | POA: Diagnosis not present

## 2021-03-13 DIAGNOSIS — C3490 Malignant neoplasm of unspecified part of unspecified bronchus or lung: Secondary | ICD-10-CM

## 2021-03-13 DIAGNOSIS — Z880 Allergy status to penicillin: Secondary | ICD-10-CM | POA: Diagnosis not present

## 2021-03-13 DIAGNOSIS — Z7982 Long term (current) use of aspirin: Secondary | ICD-10-CM | POA: Insufficient documentation

## 2021-03-13 DIAGNOSIS — Z91040 Latex allergy status: Secondary | ICD-10-CM | POA: Insufficient documentation

## 2021-03-13 DIAGNOSIS — Z87891 Personal history of nicotine dependence: Secondary | ICD-10-CM | POA: Diagnosis not present

## 2021-03-13 DIAGNOSIS — F419 Anxiety disorder, unspecified: Secondary | ICD-10-CM | POA: Diagnosis not present

## 2021-03-13 DIAGNOSIS — I4891 Unspecified atrial fibrillation: Secondary | ICD-10-CM | POA: Diagnosis not present

## 2021-03-13 DIAGNOSIS — R918 Other nonspecific abnormal finding of lung field: Secondary | ICD-10-CM | POA: Insufficient documentation

## 2021-03-13 HISTORY — PX: PORTA CATH INSERTION: CATH118285

## 2021-03-13 LAB — GLUCOSE, CAPILLARY: Glucose-Capillary: 105 mg/dL — ABNORMAL HIGH (ref 70–99)

## 2021-03-13 SURGERY — PORTA CATH INSERTION
Anesthesia: Moderate Sedation

## 2021-03-13 MED ORDER — FENTANYL CITRATE (PF) 100 MCG/2ML IJ SOLN
INTRAMUSCULAR | Status: AC
  Filled 2021-03-13: qty 2

## 2021-03-13 MED ORDER — ONDANSETRON HCL 4 MG/2ML IJ SOLN: 4.0000 mg | Freq: Four times a day (QID) | INTRAMUSCULAR | Status: DC | PRN

## 2021-03-13 MED ORDER — METHYLPREDNISOLONE SODIUM SUCC 125 MG IJ SOLR: 125.0000 mg | Freq: Once | INTRAMUSCULAR | Status: DC | PRN

## 2021-03-13 MED ORDER — CLINDAMYCIN PHOSPHATE 300 MG/50ML IV SOLN
INTRAVENOUS | Status: AC
  Filled 2021-03-13: qty 50

## 2021-03-13 MED ORDER — CHLORHEXIDINE GLUCONATE CLOTH 2 % EX PADS
6.0000 | MEDICATED_PAD | Freq: Every day | CUTANEOUS | Status: DC
  Administered 2021-03-13: 6 via TOPICAL

## 2021-03-13 MED ORDER — DIPHENHYDRAMINE HCL 50 MG/ML IJ SOLN: 50.0000 mg | Freq: Once | INTRAMUSCULAR | Status: DC | PRN

## 2021-03-13 MED ORDER — SODIUM CHLORIDE 0.9 % IV SOLN: INTRAVENOUS | Status: DC

## 2021-03-13 MED ORDER — SODIUM CHLORIDE 0.9 % IV SOLN
Freq: Once | INTRAVENOUS | Status: DC
  Filled 2021-03-13: qty 2

## 2021-03-13 MED ORDER — MIDAZOLAM HCL 5 MG/5ML IJ SOLN
INTRAMUSCULAR | Status: AC
  Filled 2021-03-13: qty 5

## 2021-03-13 MED ORDER — FAMOTIDINE 20 MG PO TABS: 40.0000 mg | ORAL_TABLET | Freq: Once | ORAL | Status: DC | PRN

## 2021-03-13 MED ORDER — HYDROMORPHONE HCL 1 MG/ML IJ SOLN
1.0000 mg | Freq: Once | INTRAMUSCULAR | Status: DC | PRN
Start: 2021-03-13 — End: 2021-03-13

## 2021-03-13 MED ORDER — MIDAZOLAM HCL 2 MG/ML PO SYRP: 8.0000 mg | ORAL_SOLUTION | Freq: Once | ORAL | Status: DC | PRN

## 2021-03-13 MED ORDER — FENTANYL CITRATE (PF) 100 MCG/2ML IJ SOLN
INTRAMUSCULAR | Status: DC | PRN
  Administered 2021-03-13: 50 ug via INTRAVENOUS

## 2021-03-13 MED ORDER — CLINDAMYCIN PHOSPHATE 300 MG/50ML IV SOLN: 300.0000 mg | Freq: Once | INTRAVENOUS | Status: DC

## 2021-03-13 MED ORDER — FLUDEOXYGLUCOSE F - 18 (FDG) INJECTION
8.1000 | Freq: Once | INTRAVENOUS | Status: AC | PRN
  Administered 2021-03-13: 8.1 via INTRAVENOUS

## 2021-03-13 MED ORDER — MIDAZOLAM HCL 2 MG/2ML IJ SOLN
INTRAMUSCULAR | Status: DC | PRN
  Administered 2021-03-13: 2 mg via INTRAVENOUS

## 2021-03-13 SURGICAL SUPPLY — 14 items
ADH SKN CLS APL DERMABOND .7 (GAUZE/BANDAGES/DRESSINGS) ×1
COVER PROBE U/S 5X48 (MISCELLANEOUS) ×2 IMPLANT
DERMABOND ADVANCED (GAUZE/BANDAGES/DRESSINGS) ×1
DERMABOND ADVANCED .7 DNX12 (GAUZE/BANDAGES/DRESSINGS) ×1 IMPLANT
HANDLE YANKAUER SUCT BULB TIP (MISCELLANEOUS) ×2 IMPLANT
KIT PORT POWER 8FR ISP CVUE (Port) ×2 IMPLANT
PACK ANGIOGRAPHY (CUSTOM PROCEDURE TRAY) ×2 IMPLANT
PENCIL ELECTRO HAND CTR (MISCELLANEOUS) ×2 IMPLANT
SPONGE XRAY 4X4 16PLY STRL (MISCELLANEOUS) ×2 IMPLANT
SUT MNCRL AB 4-0 PS2 18 (SUTURE) ×2 IMPLANT
SUT VIC AB 3-0 SH 27 (SUTURE) ×2
SUT VIC AB 3-0 SH 27X BRD (SUTURE) ×1 IMPLANT
TOWEL OR 17X26 4PK STRL BLUE (TOWEL DISPOSABLE) ×2 IMPLANT
TUBING CONNECTING 10 (TUBING) ×4 IMPLANT

## 2021-03-13 NOTE — Progress Notes (Signed)
Called PET scan; not able to accommodate pt. Until 1 PM. Pt. To go home with friend [name] & return at 1 PM. Pt. Agreeable . Stable for DC home at present.

## 2021-03-13 NOTE — Op Note (Signed)
      [facility]       Operative Note  Date: 03/13/2021  Preoperative diagnosis:  1. Lung cancer  Postoperative diagnosis:  Same as above  Procedures: #1. Ultrasound guidance for vascular access to the right internal jugular vein. #2. Fluoroscopic guidance for placement of catheter. #3. Placement of CT compatible Port-A-Cath, right internal jugular vein.  Surgeon: Leotis Pain, MD.   Anesthesia: Local with moderate conscious sedation for approximately 15  minutes using 2 mg of Versed and 50 mcg of Fentanyl  Fluoroscopy time: less than 1 minute  Contrast used: 0  Estimated blood loss: 10 cc  Indication for the procedure:  The patient is a 75 y.o.female with lung cancer.  The patient needs a Port-A-Cath for durable venous access, chemotherapy, lab draws, and CT scans. We are asked to place this. Risks and benefits were discussed and informed consent was obtained.  Description of procedure: The patient was brought to the vascular and interventional radiology suite.  Moderate conscious sedation was administered throughout the procedure during a face to face encounter with the patient with my supervision of the RN administering medicines and monitoring the patient's vital signs, pulse oximetry, telemetry and mental status throughout from the start of the procedure until the patient was taken to the recovery room. The right neck chest and shoulder were sterilely prepped and draped, and a sterile surgical field was created. Ultrasound was used to help visualize a patent right internal jugular vein. This was then accessed under direct ultrasound guidance without difficulty with the Seldinger needle and a permanent image was recorded. A J-wire was placed. After skin nick and dilatation, the peel-away sheath was then placed over the wire. I then anesthetized an area under the clavicle approximately 1-2 fingerbreadths. A transverse incision was created and an inferior pocket was  created with electrocautery and blunt dissection. The port was then brought onto the field, placed into the pocket and secured to the chest wall with 2 Prolene sutures. The catheter was connected to the port and tunneled from the subclavicular incision to the access site. Fluoroscopic guidance was then used to cut the catheter to an appropriate length. The catheter was then placed through the peel-away sheath and the peel-away sheath was removed. The catheter tip was parked in excellent location under fluorocoscopic guidance in the cavoatrial junction. The pocket was then irrigated with antibiotic impregnated saline and the wound was closed with a running 3-0 Vicryl and a 4-0 Monocryl. The access incision was closed with a single 4-0 Monocryl. The Huber needle was used to withdraw blood and flush the port with heparinized saline. Dermabond was then placed as a dressing. The patient tolerated the procedure well and was taken to the recovery room in stable condition.   Leotis Pain 03/13/2021 9:56 AM   This note was created with Dragon Medical transcription system. Any errors in dictation are purely unintentional.

## 2021-03-13 NOTE — Interval H&P Note (Signed)
History and Physical Interval Note:  03/13/2021 9:35 AM  Brittany Wheeler  has presented today for surgery, with the diagnosis of Porta Cath Insertion   Lung mass.  The various methods of treatment have been discussed with the patient and family. After consideration of risks, benefits and other options for treatment, the patient has consented to  Procedure(s): PORTA CATH INSERTION (N/A) as a surgical intervention.  The patient's history has been reviewed, patient examined, no change in status, stable for surgery.  I have reviewed the patient's chart and labs.  Questions were answered to the patient's satisfaction.     Leotis Pain

## 2021-03-14 ENCOUNTER — Telehealth: Payer: Self-pay | Admitting: *Deleted

## 2021-03-14 ENCOUNTER — Telehealth: Payer: Self-pay | Admitting: Cardiology

## 2021-03-14 ENCOUNTER — Ambulatory Visit (INDEPENDENT_AMBULATORY_CARE_PROVIDER_SITE_OTHER): Payer: Medicare Other

## 2021-03-14 DIAGNOSIS — I480 Paroxysmal atrial fibrillation: Secondary | ICD-10-CM

## 2021-03-14 DIAGNOSIS — C3490 Malignant neoplasm of unspecified part of unspecified bronchus or lung: Secondary | ICD-10-CM

## 2021-03-14 MED ORDER — PERFLUTREN LIPID MICROSPHERE
1.0000 mL | INTRAVENOUS | Status: AC | PRN
Start: 2021-03-14 — End: 2021-03-14
  Administered 2021-03-14: 3 mL via INTRAVENOUS

## 2021-03-14 NOTE — Telephone Encounter (Signed)
Spoke with pt's neighbor, [name], who requested social work consult to discuss options for safety concerns with pt staying at home by herself. Referral placed for SW to contact [facility] to further discuss concerns.

## 2021-03-14 NOTE — Telephone Encounter (Signed)
Message left on Cheryl's confidential voicemail with verbal orders.

## 2021-03-14 NOTE — Telephone Encounter (Addendum)
Spoke with Ana at the Cancer center and discussed monitor that patient is wearing. Advised that it should not be removed if at all possible and discussed if they are able to do her treatments with it in place. She will check with their team to see if that is possible. She did inquire if radiation would affect the transmission and advised that it should not but once monitor is returned we can review data and if needed then have patient wear another one. She verbalized understanding with no further questions.   Checked with [employer] representative and she states that there should not be a problem if it can be left in place. Once it is sent back and downloaded we can then see if additional monitor is needed.

## 2021-03-14 NOTE — Telephone Encounter (Signed)
Please call to discuss holter monitor. Nurse asks if patient will be able to take off during radiation. Radiation ususally takes 10-15 minutes daily. Patient will have 30 treatments.

## 2021-03-14 NOTE — Telephone Encounter (Signed)
Heard back from triage RN at cardiology office.  The Holter monitor is placed on Left upper chest and should be fine to be left on during radiation treatments.  If they get an unclear reading, they can repeat the test in the future.

## 2021-03-14 NOTE — Telephone Encounter (Signed)
Error

## 2021-03-14 NOTE — Telephone Encounter (Signed)
[facility] calling  States that patient starts radiation on Thursday and they need to know if this will be allowed with Holter monitor - needs to know ASAP Please call Ana to discuss

## 2021-03-14 NOTE — Telephone Encounter (Signed)
I have reached out to cardiology twice today, I am currently awaiting a return call.   There is question about a Holter monitor that was applied on Friday and if she is able to remove it during radiation treatments, if not the location of the monitor and the dosage of radiation the monitor can tolerate if unable to remove.   Radiation is currently scheduled to start on Thursday 03/16/21.

## 2021-03-14 NOTE — Telephone Encounter (Signed)
[facility] would like verbal orders for: 9 weeks of home health aid one time a week Cardio pulmonary assesstment related to lung cancer and asthma. [phone]

## 2021-03-15 ENCOUNTER — Inpatient Hospital Stay: Payer: Medicare Other

## 2021-03-15 ENCOUNTER — Inpatient Hospital Stay (HOSPITAL_BASED_OUTPATIENT_CLINIC_OR_DEPARTMENT_OTHER): Payer: Medicare Other | Admitting: Hospice and Palliative Medicine

## 2021-03-15 ENCOUNTER — Encounter: Payer: Self-pay | Admitting: Oncology

## 2021-03-15 ENCOUNTER — Inpatient Hospital Stay (HOSPITAL_BASED_OUTPATIENT_CLINIC_OR_DEPARTMENT_OTHER): Payer: Medicare Other | Admitting: Oncology

## 2021-03-15 ENCOUNTER — Telehealth: Payer: Self-pay

## 2021-03-15 ENCOUNTER — Ambulatory Visit: Payer: Medicare Other

## 2021-03-15 ENCOUNTER — Other Ambulatory Visit: Payer: Self-pay | Admitting: Oncology

## 2021-03-15 ENCOUNTER — Encounter: Payer: Self-pay | Admitting: *Deleted

## 2021-03-15 ENCOUNTER — Other Ambulatory Visit: Payer: Self-pay

## 2021-03-15 VITALS — BP 139/84 | HR 83 | Temp 96.1°F | Resp 16

## 2021-03-15 VITALS — BP 88/60 | HR 90 | Temp 96.0°F | Resp 16 | Wt 156.0 lb

## 2021-03-15 DIAGNOSIS — C342 Malignant neoplasm of middle lobe, bronchus or lung: Secondary | ICD-10-CM

## 2021-03-15 DIAGNOSIS — R918 Other nonspecific abnormal finding of lung field: Secondary | ICD-10-CM | POA: Diagnosis not present

## 2021-03-15 DIAGNOSIS — Z515 Encounter for palliative care: Secondary | ICD-10-CM

## 2021-03-15 DIAGNOSIS — I480 Paroxysmal atrial fibrillation: Secondary | ICD-10-CM

## 2021-03-15 DIAGNOSIS — R531 Weakness: Secondary | ICD-10-CM | POA: Diagnosis not present

## 2021-03-15 LAB — CBC WITH DIFFERENTIAL/PLATELET
Abs Immature Granulocytes: 0.13 10*3/uL — ABNORMAL HIGH (ref 0.00–0.07)
Basophils Absolute: 0.1 10*3/uL (ref 0.0–0.1)
Basophils Relative: 1 %
Eosinophils Absolute: 0.1 10*3/uL (ref 0.0–0.5)
Eosinophils Relative: 0 %
HCT: 37.1 % (ref 36.0–46.0)
Hemoglobin: 12.1 g/dL (ref 12.0–15.0)
Immature Granulocytes: 1 %
Lymphocytes Relative: 6 %
Lymphs Abs: 0.8 10*3/uL (ref 0.7–4.0)
MCH: 29.4 pg (ref 26.0–34.0)
MCHC: 32.6 g/dL (ref 30.0–36.0)
MCV: 90.3 fL (ref 80.0–100.0)
Monocytes Absolute: 0.9 10*3/uL (ref 0.1–1.0)
Monocytes Relative: 6 %
Neutro Abs: 12.9 10*3/uL — ABNORMAL HIGH (ref 1.7–7.7)
Neutrophils Relative %: 86 %
Platelets: 376 10*3/uL (ref 150–400)
RBC: 4.11 MIL/uL (ref 3.87–5.11)
RDW: 13.4 % (ref 11.5–15.5)
WBC: 14.9 10*3/uL — ABNORMAL HIGH (ref 4.0–10.5)
nRBC: 0 % (ref 0.0–0.2)

## 2021-03-15 LAB — ECHOCARDIOGRAM COMPLETE
AR max vel: 2.1 cm2
AV Area VTI: 2.1 cm2
AV Area mean vel: 2.03 cm2
AV Mean grad: 3 mmHg
AV Peak grad: 6.2 mmHg
Ao pk vel: 1.24 m/s
Area-P 1/2: 6.07 cm2
MV VTI: 3.66 cm2
S' Lateral: 2.2 cm

## 2021-03-15 LAB — COMPREHENSIVE METABOLIC PANEL
ALT: 12 U/L (ref 0–44)
AST: 15 U/L (ref 15–41)
Albumin: 3.5 g/dL (ref 3.5–5.0)
Alkaline Phosphatase: 57 U/L (ref 38–126)
Anion gap: 8 (ref 5–15)
BUN: 27 mg/dL — ABNORMAL HIGH (ref 8–23)
CO2: 26 mmol/L (ref 22–32)
Calcium: 11 mg/dL — ABNORMAL HIGH (ref 8.9–10.3)
Chloride: 97 mmol/L — ABNORMAL LOW (ref 98–111)
Creatinine, Ser: 1.38 mg/dL — ABNORMAL HIGH (ref 0.44–1.00)
GFR, Estimated: 40 mL/min — ABNORMAL LOW (ref >60)
Glucose, Bld: 145 mg/dL — ABNORMAL HIGH (ref 70–99)
Potassium: 3.9 mmol/L (ref 3.5–5.1)
Sodium: 131 mmol/L — ABNORMAL LOW (ref 135–145)
Total Bilirubin: 0.8 mg/dL (ref 0.3–1.2)
Total Protein: 7.3 g/dL (ref 6.5–8.1)

## 2021-03-15 MED ORDER — SODIUM CHLORIDE 0.9 % IV SOLN
129.0000 mg | Freq: Once | INTRAVENOUS | Status: AC
  Administered 2021-03-15: 130 mg via INTRAVENOUS
  Filled 2021-03-15: qty 13

## 2021-03-15 MED ORDER — SODIUM CHLORIDE 0.9 % IV SOLN
Freq: Once | INTRAVENOUS | Status: AC
  Filled 2021-03-15: qty 250

## 2021-03-15 MED ORDER — HEPARIN SOD (PORK) LOCK FLUSH 100 UNIT/ML IV SOLN
500.0000 [IU] | Freq: Once | INTRAVENOUS | Status: AC | PRN
  Administered 2021-03-15: 500 [IU]
  Filled 2021-03-15: qty 5

## 2021-03-15 MED ORDER — SODIUM CHLORIDE 0.9 % IV SOLN
10.0000 mg | Freq: Once | INTRAVENOUS | Status: AC
  Administered 2021-03-15: 10 mg via INTRAVENOUS
  Filled 2021-03-15: qty 10

## 2021-03-15 MED ORDER — HEPARIN SOD (PORK) LOCK FLUSH 100 UNIT/ML IV SOLN
INTRAVENOUS | Status: AC
  Filled 2021-03-15: qty 5

## 2021-03-15 MED ORDER — PALONOSETRON HCL INJECTION 0.25 MG/5ML
0.2500 mg | Freq: Once | INTRAVENOUS | Status: AC
  Administered 2021-03-15: 0.25 mg via INTRAVENOUS
  Filled 2021-03-15: qty 5

## 2021-03-15 MED ORDER — FAMOTIDINE 20 MG IN NS 100 ML IVPB
20.0000 mg | Freq: Once | INTRAVENOUS | Status: AC
  Administered 2021-03-15: 20 mg via INTRAVENOUS
  Filled 2021-03-15: qty 20

## 2021-03-15 MED ORDER — SODIUM CHLORIDE 0.9 % IV SOLN
45.0000 mg/m2 | Freq: Once | INTRAVENOUS | Status: AC
  Administered 2021-03-15: 78 mg via INTRAVENOUS
  Filled 2021-03-15: qty 13

## 2021-03-15 MED ORDER — DIPHENHYDRAMINE HCL 50 MG/ML IJ SOLN
25.0000 mg | Freq: Once | INTRAMUSCULAR | Status: AC
  Administered 2021-03-15: 25 mg via INTRAVENOUS
  Filled 2021-03-15: qty 1

## 2021-03-15 NOTE — Progress Notes (Signed)
Patient reports having a good appetite but not able to eat much at a time, wt is down 3 lbs.

## 2021-03-15 NOTE — Patient Instructions (Signed)
[facility]  Discharge Instructions: Thank you for choosing [facility] to provide your oncology and hematology care.  If you have a lab appointment with the [facility], please go directly to the [facility] and check in at the registration area.  Wear comfortable clothing and clothing appropriate for easy access to any Portacath or PICC line.   We strive to give you quality time with your provider. You may need to reschedule your appointment if you arrive late (15 or more minutes).  Arriving late affects you and other patients whose appointments are after yours.  Also, if you miss three or more appointments without notifying the office, you may be dismissed from the clinic at the provider's discretion.      For prescription refill requests, have your pharmacy contact our office and allow 72 hours for refills to be completed.    Today you received the following chemotherapy and/or immunotherapy agents Taxol and Carboplatin       To help prevent nausea and vomiting after your treatment, we encourage you to take your nausea medication as directed.  BELOW ARE SYMPTOMS THAT SHOULD BE REPORTED IMMEDIATELY: *FEVER GREATER THAN 100.4 F (38 C) OR HIGHER *CHILLS OR SWEATING *NAUSEA AND VOMITING THAT IS NOT CONTROLLED WITH YOUR NAUSEA MEDICATION *UNUSUAL SHORTNESS OF BREATH *UNUSUAL BRUISING OR BLEEDING *URINARY PROBLEMS (pain or burning when urinating, or frequent urination) *BOWEL PROBLEMS (unusual diarrhea, constipation, pain near the anus) TENDERNESS IN MOUTH AND THROAT WITH OR WITHOUT PRESENCE OF ULCERS (sore throat, sores in mouth, or a toothache) UNUSUAL RASH, SWELLING OR PAIN  UNUSUAL VAGINAL DISCHARGE OR ITCHING   Items with * indicate a potential emergency and should be followed up as soon as possible or go to the Emergency Department if any problems should occur.  Please show the CHEMOTHERAPY ALERT CARD or IMMUNOTHERAPY ALERT CARD  at check-in to the Emergency Department and triage nurse.  Should you have questions after your visit or need to cancel or reschedule your appointment, please contact [facility]  [phone] and follow the prompts.  Office hours are 8:00 a.m. to 4:30 p.m. Monday - Friday. Please note that voicemails left after 4:00 p.m. may not be returned until the following business day.  We are closed weekends and major holidays. You have access to a nurse at all times for urgent questions. Please call the main number to the clinic [phone] and follow the prompts.  For any non-urgent questions, you may also contact your provider using MyChart. We now offer e-Visits for anyone 38 and older to request care online for non-urgent symptoms. For details visit [email or web address].   Also download the MyChart app! Go to the app store, search "MyChart", open the app, select [facility], and log in with your MyChart username and password.  Due to Covid, a mask is required upon entering the hospital/clinic. If you do not have a mask, one will be given to you upon arrival. For doctor visits, patients may have 1 support person aged 11 or older with them. For treatment visits, patients cannot have anyone with them due to current Covid guidelines and our immunocompromised population.    Paclitaxel injection What is this medication? PACLITAXEL (PAK li TAX el) is a chemotherapy drug. It targets fast dividing cells, like cancer cells, and causes these cells to die. This medicine is used to treat ovarian cancer, breast cancer, lung cancer, Kaposi's sarcoma, andother cancers. This medicine may be used for  other purposes; ask your health care provider orpharmacist if you have questions. COMMON BRAND NAME(S): Onxol, Taxol What should I tell my care team before I take this medication? They need to know if you have any of these conditions: history of irregular heartbeat liver disease low  blood counts, like low white cell, platelet, or red cell counts lung or breathing disease, like asthma tingling of the fingers or toes, or other nerve disorder an unusual or allergic reaction to paclitaxel, alcohol, polyoxyethylated castor oil, other chemotherapy, other medicines, foods, dyes, or preservatives pregnant or trying to get pregnant breast-feeding How should I use this medication? This drug is given as an infusion into a vein. It is administered in a hospitalor clinic by a specially trained health care professional. Talk to your pediatrician regarding the use of this medicine in children.Special care may be needed. Overdosage: If you think you have taken too much of this medicine contact apoison control center or emergency room at once. NOTE: This medicine is only for you. Do not share this medicine with others. What if I miss a dose? It is important not to miss your dose. Call your doctor or health careprofessional if you are unable to keep an appointment. What may interact with this medication? Do not take this medicine with any of the following medications: live virus vaccines This medicine may also interact with the following medications: antiviral medicines for hepatitis, HIV or AIDS certain antibiotics like erythromycin and clarithromycin certain medicines for fungal infections like ketoconazole and itraconazole certain medicines for seizures like carbamazepine, phenobarbital, phenytoin gemfibrozil nefazodone rifampin St. John's wort This list may not describe all possible interactions. Give your health care provider a list of all the medicines, herbs, non-prescription drugs, or dietary supplements you use. Also tell them if you smoke, drink alcohol, or use illegaldrugs. Some items may interact with your medicine. What should I watch for while using this medication? Your condition will be monitored carefully while you are receiving this medicine. You will need important  blood work done while you are taking thismedicine. This medicine can cause serious allergic reactions. To reduce your risk you will need to take other medicine(s) before treatment with this medicine. If you experience allergic reactions like skin rash, itching or hives, swelling of theface, lips, or tongue, tell your doctor or health care professional right away. In some cases, you may be given additional medicines to help with side effects.Follow all directions for their use. This drug may make you feel generally unwell. This is not uncommon, as chemotherapy can affect healthy cells as well as cancer cells. Report any side effects. Continue your course of treatment even though you feel ill unless yourdoctor tells you to stop. Call your doctor or health care professional for advice if you get a fever, chills or sore throat, or other symptoms of a cold or flu. Do not treat yourself. This drug decreases your body's ability to fight infections. Try toavoid being around people who are sick. This medicine may increase your risk to bruise or bleed. Call your doctor orhealth care professional if you notice any unusual bleeding. Be careful brushing and flossing your teeth or using a toothpick because you may get an infection or bleed more easily. If you have any dental work done,tell your dentist you are receiving this medicine. Avoid taking products that contain aspirin, acetaminophen, ibuprofen, naproxen, or ketoprofen unless instructed by your doctor. These medicines may hide afever. Do not become pregnant while taking this medicine. Women should  inform their doctor if they wish to become pregnant or think they might be pregnant. There is a potential for serious side effects to an unborn child. Talk to your health care professional or pharmacist for more information. Do not breast-feed aninfant while taking this medicine. Men are advised not to father a child while receiving this medicine. This product may  contain alcohol. Ask your pharmacist or healthcare provider if this medicine contains alcohol. Be sure to tell all healthcare providers you are taking this medicine. Certain medicines, like metronidazole and disulfiram, can cause an unpleasant reaction when taken with alcohol. The reaction includes flushing, headache, nausea, vomiting, sweating, and increased thirst. Thereaction can last from 30 minutes to several hours. What side effects may I notice from receiving this medication? Side effects that you should report to your doctor or health care professionalas soon as possible: allergic reactions like skin rash, itching or hives, swelling of the face, lips, or tongue breathing problems changes in vision fast, irregular heartbeat high or low blood pressure mouth sores pain, tingling, numbness in the hands or feet signs of decreased platelets or bleeding - bruising, pinpoint red spots on the skin, black, tarry stools, blood in the urine signs of decreased red blood cells - unusually weak or tired, feeling faint or lightheaded, falls signs of infection - fever or chills, cough, sore throat, pain or difficulty passing urine signs and symptoms of liver injury like dark yellow or brown urine; general ill feeling or flu-like symptoms; light-colored stools; loss of appetite; nausea; right upper belly pain; unusually weak or tired; yellowing of the eyes or skin swelling of the ankles, feet, hands unusually slow heartbeat Side effects that usually do not require medical attention (report to yourdoctor or health care professional if they continue or are bothersome): diarrhea hair loss loss of appetite muscle or joint pain nausea, vomiting pain, redness, or irritation at site where injected tiredness This list may not describe all possible side effects. Call your doctor for medical advice about side effects. You may report side effects to FDA at1-800-FDA-1088. Where should I keep my medication? This  drug is given in a hospital or clinic and will not be stored at home. NOTE: This sheet is a summary. It may not cover all possible information. If you have questions about this medicine, talk to your doctor, pharmacist, orhealth care provider.  2022 Elsevier/Gold Standard (2019-06-24 13:37:23)   Carboplatin injection What is this medication? CARBOPLATIN (KAR boe pla tin) is a chemotherapy drug. It targets fast dividing cells, like cancer cells, and causes these cells to die. This medicine is usedto treat ovarian cancer and many other cancers. This medicine may be used for other purposes; ask your health care provider orpharmacist if you have questions. COMMON BRAND NAME(S): Paraplatin What should I tell my care team before I take this medication? They need to know if you have any of these conditions: blood disorders hearing problems kidney disease recent or ongoing radiation therapy an unusual or allergic reaction to carboplatin, cisplatin, other chemotherapy, other medicines, foods, dyes, or preservatives pregnant or trying to get pregnant breast-feeding How should I use this medication? This drug is usually given as an infusion into a vein. It is administered in [facility] or clinic by a specially trained health care professional. Talk to your pediatrician regarding the use of this medicine in children.Special care may be needed. Overdosage: If you think you have taken too much of this medicine contact apoison control center or emergency room at once.  NOTE: This medicine is only for you. Do not share this medicine with others. What if I miss a dose? It is important not to miss a dose. Call your doctor or health careprofessional if you are unable to keep an appointment. What may interact with this medication? medicines for seizures medicines to increase blood counts like filgrastim, pegfilgrastim, sargramostim some antibiotics like amikacin, gentamicin, neomycin, streptomycin,  tobramycin vaccines Talk to your doctor or health care professional before taking any of thesemedicines: acetaminophen aspirin ibuprofen ketoprofen naproxen This list may not describe all possible interactions. Give your health care provider a list of all the medicines, herbs, non-prescription drugs, or dietary supplements you use. Also tell them if you smoke, drink alcohol, or use illegaldrugs. Some items may interact with your medicine. What should I watch for while using this medication? Your condition will be monitored carefully while you are receiving this medicine. You will need important blood work done while you are taking thismedicine. This drug may make you feel generally unwell. This is not uncommon, as chemotherapy can affect healthy cells as well as cancer cells. Report any side effects. Continue your course of treatment even though you feel ill unless yourdoctor tells you to stop. In some cases, you may be given additional medicines to help with side effects.Follow all directions for their use. Call your doctor or health care professional for advice if you get a fever, chills or sore throat, or other symptoms of a cold or flu. Do not treat yourself. This drug decreases your body's ability to fight infections. Try toavoid being around people who are sick. This medicine may increase your risk to bruise or bleed. Call your doctor orhealth care professional if you notice any unusual bleeding. Be careful brushing and flossing your teeth or using a toothpick because you may get an infection or bleed more easily. If you have any dental work done,tell your dentist you are receiving this medicine. Avoid taking products that contain aspirin, acetaminophen, ibuprofen, naproxen, or ketoprofen unless instructed by your doctor. These medicines may hide afever. Do not become pregnant while taking this medicine. Women should inform their doctor if they wish to become pregnant or think they might be  pregnant. There is a potential for serious side effects to an unborn child. Talk to your health care professional or pharmacist for more information. Do not breast-feed aninfant while taking this medicine. What side effects may I notice from receiving this medication? Side effects that you should report to your doctor or health care professionalas soon as possible: allergic reactions like skin rash, itching or hives, swelling of the face, lips, or tongue signs of infection - fever or chills, cough, sore throat, pain or difficulty passing urine signs of decreased platelets or bleeding - bruising, pinpoint red spots on the skin, black, tarry stools, nosebleeds signs of decreased red blood cells - unusually weak or tired, fainting spells, lightheadedness breathing problems changes in hearing changes in vision chest pain high blood pressure low blood counts - This drug may decrease the number of white blood cells, red blood cells and platelets. You may be at increased risk for infections and bleeding. nausea and vomiting pain, swelling, redness or irritation at the injection site pain, tingling, numbness in the hands or feet problems with balance, talking, walking trouble passing urine or change in the amount of urine Side effects that usually do not require medical attention (report to yourdoctor or health care professional if they continue or are bothersome): hair loss loss  of appetite metallic taste in the mouth or changes in taste This list may not describe all possible side effects. Call your doctor for medical advice about side effects. You may report side effects to FDA at1-800-FDA-1088. Where should I keep my medication? This drug is given in a hospital or clinic and will not be stored at home. NOTE: This sheet is a summary. It may not cover all possible information. If you have questions about this medicine, talk to your doctor, pharmacist, orhealth care provider.  2022 Elsevier/Gold  Standard (2007-10-28 14:38:05)

## 2021-03-15 NOTE — Progress Notes (Signed)
1325: While hanging Carboplatin, Pt reported swelling in left hand and forearm (patient's arm/hand has been under a blanket up until this point). Pitting edema noted in left hand and arm. VS stable. Slight facial flushing noted. Left arm propped up on a pillow, pt denies any other symptoms at this time. Dr. [name] aware. Per Dr. [name] proceed with Carboplatin, arm to remain uncovered and visible and monitored.   1404: Swelling in left hand and forearm., facial flushing still noted/  B/P 144/82, Dr. [name] aware. Pt continues to deny any other concerns. No s/s of distress noted.   1435: Symptoms unchanged. Pt denies any concerns. VS and patient stable. Per Dr. [name] okay to discharge pt home.  1445: Pt educated when to seek emergency care, and to call clinic for any questions. Pt verbalizes understanding. Pt and VS stable.

## 2021-03-15 NOTE — Progress Notes (Signed)
Blood sample collected today via portacath for liquid NGS testing with [facility]. Sample remained on ice and placed for fedex shipment. Fedex tracking # [id number].

## 2021-03-15 NOTE — Telephone Encounter (Signed)
Patient has changed pharmacy to [facility].  Confirmed with [facility] that they have received patient's prescriptions from [facility] which includes the Compazine and Zofran.  I requested them to fill these 2 medications and I will inform patient [facility] will have prescriptions ready for her to pick up.

## 2021-03-15 NOTE — Progress Notes (Signed)
[facility]  [phone] [phone]   Name: Brittany Wheeler Date: 03/15/2021 MRN: [id number]  DOB: April 20, 1946  Patient Care Team: [name], MD as PCP - General (Family Medicine) [name], MD as PCP - Cardiology (Cardiology) [name], RN as Oncology Nurse Navigator    REASON FOR CONSULTATION: Brittany Wheeler is a 75 y.o. 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 Patient was hospitalized in July 2022 for shortness of breath and was found to have a large bulky central right-sided lung mass.  Patient underwent bronchoscopy on 03/06/2021 with pathology favoring squamous cell carcinoma.  PET scan on 03/13/2021 revealed hypermetabolic large right middle lobe lung mass with extensive invasion of the right hilum and mediastinum.  Patient had weekly hypermetabolic small bilateral supraclavicular nodes but no findings to suggest distal metastatic disease.  Palliative care was consulted up address goals and manage ongoing symptoms.  SOCIAL HISTORY:     reports that she quit smoking about 10 years ago. Her smoking use included cigarettes. She has never used smokeless tobacco. She reports previous alcohol use. She reports that she does not use drugs.  Patient was never married and has no children.  She lives at home alone with two Deneen Harts and [place].  She has a neighbor, Lawyer, who is her healthcare power of attorney.  Patient has a cousin from out of state.  ADVANCE DIRECTIVES:  On file  CODE STATUS: DNR  PAST MEDICAL HISTORY: Past Medical History:  Diagnosis Date   Anxiety    Arthritis    Asthma    Cancer ([place])    benign mass removed   Complication of anesthesia    Dyspnea    Dysrhythmia    Hyperlipidemia     Hypertension    Hypothyroidism    PONV (postoperative nausea and vomiting)    Pre-diabetes    Thyroid disease     PAST SURGICAL HISTORY:  Past Surgical History:  Procedure Laterality Date   COLONOSCOPY     FLEXIBLE BRONCHOSCOPY Bilateral 03/06/2021   Procedure: FLEXIBLE BRONCHOSCOPY;  Surgeon: [name], MD;  Location: [facility] ORS;  Service: Pulmonary;  Laterality: Bilateral;   PORTA CATH INSERTION N/A 03/13/2021   Procedure: PORTA CATH INSERTION;  Surgeon: [name], MD;  Location: [facility];  Service: Cardiovascular;  Laterality: N/A;   stent placed in kidney     THYROIDECTOMY      HEMATOLOGY/ONCOLOGY HISTORY:  Oncology History  Primary cancer of right middle lobe of lung ([place])  03/07/2021 Initial Diagnosis   Primary cancer of right middle lobe of lung ([place])    03/07/2021 Cancer Staging   Staging form: Lung, AJCC 8th Edition - Clinical stage from 03/07/2021: Stage IIIB (cT4, cN2, cM0) - Signed by [name], MD on 03/07/2021  Stage prefix: Initial diagnosis    03/15/2021 -  Chemotherapy    Patient is on Treatment Plan: LUNG CARBOPLATIN / PACLITAXEL + XRT Q7D         ALLERGIES:  is allergic to atorvastatin, latex, lovastatin, and penicillins.  MEDICATIONS:  Current Outpatient Medications  Medication Sig Dispense Refill   ALPRAZolam (XANAX) 0.25 MG tablet Take 1 tablet (0.25 mg total) by mouth 2 (two) times daily as needed for anxiety. 60 tablet 0   apixaban (ELIQUIS) 5 MG TABS tablet Take 1 tablet (5 mg  total) by mouth 2 (two) times daily. (Patient not taking: Reported on 03/15/2021) 60 tablet 5   Budeson-Glycopyrrol-Formoterol (BREZTRI AEROSPHERE) 160-9-4.8 MCG/ACT AERO Inhale 1 spray into the lungs 2 (two) times daily. 10.7 g 2   diltiazem (CARDIZEM CD) 240 MG 24 hr capsule Take 1 capsule (240 mg total) by mouth daily. To control your heart rate from Afib. 30 capsule 2   ezetimibe (ZETIA) 10 MG tablet TAKE 1 TABLET(10 MG) BY MOUTH EVERY DAY (Patient not  taking: No sig reported)     fluticasone (FLONASE) 50 MCG/ACT nasal spray Place 1 spray into both nostrils in the morning and at bedtime.     guaiFENesin-dextromethorphan (ROBITUSSIN DM) 100-10 MG/5ML syrup Take 10 mLs by mouth every 6 (six) hours as needed for cough. 118 mL 0   hydrochlorothiazide (HYDRODIURIL) 12.5 MG tablet Take 12.5 mg by mouth daily.     ipratropium (ATROVENT) 0.06 % nasal spray Place 2 sprays into both nostrils 3 (three) times daily.     ipratropium-albuterol (DUONEB) 0.5-2.5 (3) MG/3ML SOLN Take 3 mLs by nebulization every 6 (six) hours as needed. 120 mL 2   levocetirizine (XYZAL) 5 MG tablet Take 5 mg by mouth daily as needed.     levothyroxine (SYNTHROID) 112 MCG tablet Take 112 mcg by mouth daily.     lidocaine-prilocaine (EMLA) cream Apply 1 application topically as needed. 30 g 0   Multiple Vitamin (MULTI-VITAMIN) tablet Take 1 tablet by mouth daily.     ondansetron (ZOFRAN) 8 MG tablet Take 1 tablet (8 mg total) by mouth 2 (two) times daily as needed for refractory nausea / vomiting. (Patient not taking: No sig reported) 60 tablet 1   PARoxetine (PAXIL) 20 MG tablet Take 20 mg by mouth daily.     prochlorperazine (COMPAZINE) 10 MG tablet Take 1 tablet (10 mg total) by mouth every 6 (six) hours as needed (Nausea or vomiting). (Patient not taking: No sig reported) 60 tablet 1   No current facility-administered medications for this visit.    VITAL SIGNS: There were no vitals taken for this visit. There were no vitals filed for this visit.  Estimated body mass index is 29.48 kg/m as calculated from the following:   Height as of 03/13/21: _0  (1.549 m).   Weight as of an earlier encounter on 03/15/21: 156 lb (70.8 kg).  LABS: CBC:    Component Value Date/Time   WBC 14.9 (H) 03/15/2021 0830   HGB 12.1 03/15/2021 0830   HCT 37.1 03/15/2021 0830   PLT 376 03/15/2021 0830   MCV 90.3 03/15/2021 0830   NEUTROABS 12.9 (H) 03/15/2021 0830   LYMPHSABS 0.8 03/15/2021  0830   MONOABS 0.9 03/15/2021 0830   EOSABS 0.1 03/15/2021 0830   BASOSABS 0.1 03/15/2021 0830   Comprehensive Metabolic Panel:    Component Value Date/Time   NA 131 (L) 03/15/2021 0830   K 3.9 03/15/2021 0830   CL 97 (L) 03/15/2021 0830   CO2 26 03/15/2021 0830   BUN 27 (H) 03/15/2021 0830   CREATININE 1.38 (H) 03/15/2021 0830   GLUCOSE 145 (H) 03/15/2021 0830   CALCIUM 11.0 (H) 03/15/2021 0830   AST 15 03/15/2021 0830   ALT 12 03/15/2021 0830   ALKPHOS 57 03/15/2021 0830   BILITOT 0.8 03/15/2021 0830   PROT 7.3 03/15/2021 0830   ALBUMIN 3.5 03/15/2021 0830    RADIOGRAPHIC STUDIES: CT Angio Chest PE W and/or Wo Contrast  Result Date: 02/27/2021 CLINICAL DATA:  Chest pain and shortness  of breath, wheezing EXAM: CT ANGIOGRAPHY CHEST WITH CONTRAST TECHNIQUE: Multidetector CT imaging of the chest was performed using the standard protocol during bolus administration of intravenous contrast. Multiplanar CT image reconstructions and MIPs were obtained to evaluate the vascular anatomy. CONTRAST:  21m OMNIPAQUE IOHEXOL 350 MG/ML SOLN COMPARISON:  None. FINDINGS: Cardiovascular: Thoracic aortic atherosclerosis without acute aneurysm or dissection. Patent 3 vessel arch anatomy. Pulmonary arteries appear patent without acute filling defect or pulmonary embolus. Segmental pulmonary branches to the right middle lobe are compressed by the right middle lobe mass described below. Normal heart size. Native coronary atherosclerosis noted. No pericardial effusion. Mediastinum/Nodes: Bulky right paratracheal adenopathy measures 6 x 5.3 cm. Enlarged central left AP window lymph node measures 2.8 x 3.1 cm. Subcarinal adenopathy measures 3.2 x 2.9 cm. These are compatible with mediastinal metastatic adenopathy. Suspect additional ill-defined and difficult to measure right hilar adenopathy. Lungs/Pleura: Large central bulky right middle lobe mass measures approximately 7 x 4.7 x 6 cm. Mass does appear to invade  into the right hilum with encasement of the central bronchial structures and abutting against the left atrium and right inferior pulmonary vein. 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 There is complete collapse or invasion of the right middle lobe bronchi. Bronchus intermedius also significantly encased and narrowed proximally. Central right lung surrounding interstitial micro nodularity suggesting right lung regional lymphangitic tumor spread in the right middle lobe and right upper lobe. Mild nonspecific right upper lobe patchy peribronchovascular airspace opacities. Left lung remains clear. Small non loculated right pleural effusion with associated right basilar subsegmental atelectasis. Upper Abdomen: Enlarged upper abdomen gastric hepatic ligament lymph node measures 1.6 cm, image 82/4. No acute upper abdominal finding. Abdominal atherosclerosis noted. Musculoskeletal: Degenerative changes of the spine. No acute osseous finding. No osseous lytic or blastic disease. Sternum intact. Review of the MIP images confirms the above findings. IMPRESSION: Large bulky central right middle lobe mass measuring up to 7 cm compatible with a primary bronchogenic carcinoma with evidence of bulky metastatic mediastinal adenopathy and suspect surrounding right middle lobe and right upper lobe lymphangitic tumor spread. Also enlarged upper abdominal gastrohepatic lymph nodes suspicious for metastatic disease. Associated trace right pleural effusion and right basilar atelectasis. Negative for significant acute pulmonary embolus. Aortic Atherosclerosis (ICD10-I70.0). These results were called by telephone at the time of interpretation on 02/27/2021 at 11:28 am to provider Dr. EAlwyn Ren who verbally acknowledged these results. Electronically Signed   By: MJerilynn Mages  Shick M.D.   On: 02/27/2021 11:30   MR BRAIN W WO CONTRAST  Result Date: 02/28/2021 CLINICAL DATA:  Lung cancer, staging EXAM: MRI HEAD WITHOUT AND  WITH CONTRAST TECHNIQUE: Multiplanar, multiecho pulse sequences of the brain and surrounding structures were obtained without and with intravenous contrast. CONTRAST:  698mGADAVIST GADOBUTROL 1 MMOL/ML IV SOLN COMPARISON:  None. FINDINGS: Brain: There is no acute infarction or intracranial hemorrhage. There is no intracranial mass, mass effect, or edema. There is no hydrocephalus or extra-axial fluid collection. Ventricles and sulci are within normal limits in size and configuration. Patchy and confluent areas of T2 hyperintensity in the supratentorial and pontine white matter are nonspecific may reflect mild to moderate chronic microvascular ischemic changes. No abnormal enhancement. Vascular: Major vessel flow voids at the skull base are preserved. Skull and upper cervical spine: Normal marrow signal is preserved. Sinuses/Orbits: Paranasal sinuses are aerated. Orbits are unremarkable. Other: Sella is unremarkable.  Mastoid air cells are clear. IMPRESSION: No evidence of intracranial metastatic disease. Chronic microvascular ischemic  changes. Electronically Signed   By: Macy Mis M.D.   On: 02/28/2021 20:57   PERIPHERAL VASCULAR CATHETERIZATION  Result Date: 03/13/2021 See surgical note for result.  NM PET Image Initial (PI) Skull Base To Thigh  Result Date: 03/14/2021 CLINICAL DATA:  Initial treatment strategy for right lung mass and adenopathy. EXAM: NUCLEAR MEDICINE PET SKULL BASE TO THIGH TECHNIQUE: 8.1 mCi F-18 FDG was injected intravenously. Full-ring PET imaging was performed from the skull base to thigh after the radiotracer. CT data was obtained and used for attenuation correction and anatomic localization. Fasting blood glucose: 105 mg/dl COMPARISON:  Chest CT 02/27/2021 FINDINGS: Mediastinal blood pool activity: SUV max 2.51 Liver activity: SUV max NA NECK: No hypermetabolic lymph nodes in the neck. Incidental CT findings: none CHEST: Large right middle lobe lung mass is hypermetabolic with  SUV max of 19.63. This is invading the right hilum and mediastinum. Extensive right paratracheal adenopathy with SUV max of 24.41. There is also contralateral AP window adenopathy. Subcarinal adenopathy has an SUV max of 17.69. Small right pleural effusion without hypermetabolic pleural nodules. Few patchy E ill-defined sub solid nodular opacities appear relatively stable. No definite hypermetabolism but recommend CT surveillance. There are several small bilateral supraclavicular nodes which are weakly hypermetabolic. SUV max ranging between 2.5 and 2.6. Incidental CT findings: Vascular calcifications. ABDOMEN/PELVIS: No abnormal hypermetabolic activity within the liver, pancreas, adrenal glands, or spleen. No hypermetabolic lymph nodes in the abdomen or pelvis. Incidental CT findings: Left pelvic kidney is noted with moderate hydronephrosis. SKELETON: No findings suspicious for osseous metastatic disease. Incidental CT findings: none IMPRESSION: 1. Large right middle lobe lung mass is hypermetabolic and consistent with neoplasm. 2. Extensive invasion/involvement of the right hilum and mediastinum. 3. Patchy ground-glass nodules in both lungs will require CT surveillance. 4. Small bilateral supraclavicular nodes are weakly hypermetabolic and suspicious for metastatic adenopathy. 5. No findings to suggest abdominal/pelvic metastatic disease or osseous metastatic disease. Electronically Signed   By: Marijo Sanes M.D.   On: 03/14/2021 15:48    PERFORMANCE STATUS (ECOG) : 2 - Symptomatic, <50% confined to bed  Review of Systems Unless otherwise noted, a complete review of systems is negative.  Physical Exam General: NAD Pulmonary: Unlabored Extremities: no edema, no joint deformities Skin: no rashes Neurological: Weakness but otherwise nonfocal  IMPRESSION: Met with patient, her cousin, and her neighbor.  Introduced palliative care services and attempted to establish therapeutic rapport.  Patient  understands that she has a stage IIIb lung cancer and is agreeing to pursue treatment with chemotherapy and radiation.  She says that in the event the treatment fails or if the symptomatic burden becomes too high, she may reconsider her desire to pursue treatment but otherwise remains committed to the plan.  At baseline, she lives at home alone.  She has never married and has no children.  She has a cousin who lives in [place] that has been staying with her in the past couple of weeks but is going home soon.  She has a neighbor, Lawyer, who is her healthcare power of attorney and has also been involved in coordinating care.  Home health has been ordered but patient has yet to see a physical therapist.  Patient is ambulatory but has severe dyspnea on exertion that limits her ability to perform daily tasks.  Dyspnea improves with rest.  Patient also has home O2 which she just started using.  Dyspnea is significantly improved with use of O2.  Encouraged her to use it  consistently throughout the day.  Hopefully, her symptomatic burden will improve once treatment starts.  We discussed home medical equipment.  Patient tells me that home health has ordered a walker, bedside commode, and shower chair.  Patient has meeting later today with an aide service but is unsure that she cannot afford extra help.  Patient also endorses anxiety.  She finds that anxiety is worse at nighttime when she is trying to sleep.  She is having difficulty "turning off" her thoughts.  She has been managed for the past several years on paroxetine 20 mg daily.  We will increase paroxetine to 30 mg daily with plan to further increase to 40 mg daily if needed.  Additionally, she has alprazolam available to take twice daily as needed.  Patient also endorses constipation.  We discussed adjustment of her bowel regimen.  She is not taking anything currently on a regular basis.  I suggested MiraLAX with as needed Senokot.  Patient has  had poor oral intake with recent weight loss.  Will refer to nutrition.  Patient says she is a DNR/DNI.  She has a sign DNR order at home.  I encouraged her to bring that in so that we may scanned in her chart.  PLAN: -Continue current scope of treatment -DNR/DNI -Increase Paxil 30 mg daily -Bowel regimen with daily MiraLAX and as needed Senokot -Referral to nutrition -Continue home health -Referral for rehab screening -Consider community palliative care  Case and plan discussed with Dr. [name]    Patient expressed understanding and was in agreement with this plan. She also understands that She can call the clinic at any time with any questions, concerns, or complaints.     Time Total: 45 minutes  Visit consisted of counseling and education dealing with the complex and emotionally intense issues of symptom management and palliative care in the setting of serious and potentially life-threatening illness.Greater than 50%  of this time was spent counseling and coordinating care related to the above assessment and plan.  Signed by: Altha Harm, PhD, NP-C

## 2021-03-16 ENCOUNTER — Ambulatory Visit
Admission: RE | Admit: 2021-03-16 | Discharge: 2021-03-16 | Disposition: A | Discharge status: Home / Self Care | Payer: Medicare Other | Source: Ambulatory Visit | Attending: Radiation Oncology | Admitting: Radiation Oncology

## 2021-03-16 ENCOUNTER — Encounter: Payer: Self-pay | Admitting: Oncology

## 2021-03-16 ENCOUNTER — Telehealth: Payer: Self-pay | Admitting: *Deleted

## 2021-03-16 DIAGNOSIS — R918 Other nonspecific abnormal finding of lung field: Secondary | ICD-10-CM | POA: Diagnosis not present

## 2021-03-16 NOTE — Telephone Encounter (Signed)
[place] provided verbal orders.

## 2021-03-16 NOTE — H&P (Signed)
Please see consult note.  

## 2021-03-16 NOTE — Telephone Encounter (Signed)
PT would like verbal order for PT as follows: 1 week 8

## 2021-03-17 ENCOUNTER — Encounter: Payer: Self-pay | Admitting: *Deleted

## 2021-03-17 ENCOUNTER — Ambulatory Visit
Admission: RE | Admit: 2021-03-17 | Discharge: 2021-03-17 | Disposition: A | Discharge status: Home / Self Care | Payer: Medicare Other | Source: Ambulatory Visit | Attending: Radiation Oncology | Admitting: Radiation Oncology

## 2021-03-17 DIAGNOSIS — R918 Other nonspecific abnormal finding of lung field: Secondary | ICD-10-CM | POA: Diagnosis not present

## 2021-03-18 NOTE — Progress Notes (Signed)
[place]  [phone]) [phone] [phone]) [phone]  ID: Brittany Wheeler OB: 1946-02-18  MR#: [id number]  IFO#:[id number]  Patient Care Team: [name], MD as PCP - General (Family Medicine) [name], MD as PCP - Cardiology (Cardiology) [name], RN as Oncology Nurse Navigator  CHIEF COMPLAINT:Stage IIIb lung cancer.  INTERVAL HISTORY: Patient returns to clinic today for further evaluation and consideration of cycle 2 of weekly carboplatinum and Taxol.  She tolerated her first treatment well without significant side effects.  She has noticed some increased bruising on her right breast.  She continues to have increased weakness and fatigue.  She also has chronic shortness of breath and cough. She does not complain of pain.  She has no neurologic complaints.  She denies any recent fevers.  She has a fair appetite, but denies weight loss.  She has no chest pain or hemoptysis.  She has no nausea, vomiting, constipation, or diarrhea.  She has no urinary complaints.  Patient offers no further specific complaints today.  REVIEW OF SYSTEMS:   Review of Systems  Constitutional:  Positive for malaise/fatigue. Negative for fever and weight loss.  Respiratory:  Positive for cough and shortness of breath. Negative for hemoptysis.   Cardiovascular: Negative.  Negative for chest pain and leg swelling.  Gastrointestinal: Negative.  Negative for abdominal pain and nausea.  Genitourinary: Negative.  Negative for dysuria.  Musculoskeletal: Negative.  Negative for back pain.  Skin: Negative.  Negative for itching and rash.  Neurological:  Positive for weakness. Negative for dizziness, focal weakness and headaches.  Psychiatric/Behavioral: Negative.  The patient is not nervous/anxious.    As per HPI. Otherwise, a complete review of systems is negative.  PAST MEDICAL HISTORY: Past Medical History:  Diagnosis Date   Anxiety    Arthritis    Asthma    Cancer  ([place])    benign mass removed   Complication of anesthesia    Dyspnea    Dysrhythmia    Hyperlipidemia    Hypertension    Hypothyroidism    PONV (postoperative nausea and vomiting)    Pre-diabetes    Thyroid disease     PAST SURGICAL HISTORY: Past Surgical History:  Procedure Laterality Date   COLONOSCOPY     FLEXIBLE BRONCHOSCOPY Bilateral 03/06/2021   Procedure: FLEXIBLE BRONCHOSCOPY;  Surgeon: [name], MD;  Location: [facility] OR[facility];  Service: Pulmonary;  Laterality: Bilateral;   PORTA CATH INSERTION N/A 03/13/2021   Procedure: PORTA CATH INSERTION;  Surgeon: [name], MD;  Location: [place] [facility];  Service: Cardiovascular;  Laterality: N/A;   stent placed in kidney     THYROIDECTOMY      FAMILY HISTORY: Family History  Problem Relation Age of Onset   Mesothelioma Father    Rectal cancer Sister     ADVANCED DIRECTIVES (Y/N):  N  HEALTH MAINTENANCE: Social History   Tobacco Use   Smoking status: Former    Types: Cigarettes    Quit date: 2012    Years since quitting: 10.6   Smokeless tobacco: Never  Vaping Use   Vaping Use: Never used  Substance Use Topics   Alcohol use: Not Currently   Drug use: Never     Colonoscopy:  PAP:  Bone density:  Lipid panel:  Allergies  Allergen Reactions   Atorvastatin Other (See Comments)    Joint pain   Latex    Lovastatin Other (See Comments)    Arthritis pain in hands   Penicillins  Current Outpatient Medications  Medication Sig Dispense Refill   ALPRAZolam (XANAX) 0.25 MG tablet Take 1 tablet (0.25 mg total) by mouth 2 (two) times daily as needed for anxiety. 60 tablet 0   apixaban (ELIQUIS) 5 MG TABS tablet Take 1 tablet (5 mg total) by mouth 2 (two) times daily. (Patient not taking: Reported on 03/15/2021) 60 tablet 5   Budeson-Glycopyrrol-Formoterol (BREZTRI AEROSPHERE) 160-9-4.8 MCG/ACT AERO Inhale 1 spray into the lungs 2 (two) times daily. 10.7 g 2   diltiazem (CARDIZEM CD) 240 MG 24 hr capsule  Take 1 capsule (240 mg total) by mouth daily. To control your heart rate from Afib. 30 capsule 2   ezetimibe (ZETIA) 10 MG tablet TAKE 1 TABLET(10 MG) BY MOUTH EVERY DAY (Patient not taking: No sig reported)     fluconazole (DIFLUCAN) 150 MG tablet Take 1 tablet (150 mg total) by mouth daily. 7 tablet 0   fluticasone (FLONASE) 50 MCG/ACT nasal spray Place 1 spray into both nostrils in the morning and at bedtime.     guaiFENesin-dextromethorphan (ROBITUSSIN DM) 100-10 MG/5ML syrup Take 10 mLs by mouth every 6 (six) hours as needed for cough. 118 mL 0   hydrochlorothiazide (HYDRODIURIL) 12.5 MG tablet Take 12.5 mg by mouth daily.     ipratropium (ATROVENT) 0.06 % nasal spray Place 2 sprays into both nostrils 3 (three) times daily.     ipratropium-albuterol (DUONEB) 0.5-2.5 (3) MG/3ML SOLN Take 3 mLs by nebulization every 6 (six) hours as needed. 120 mL 2   levocetirizine (XYZAL) 5 MG tablet Take 5 mg by mouth daily as needed.     levothyroxine (SYNTHROID) 112 MCG tablet Take 112 mcg by mouth daily.     lidocaine-prilocaine (EMLA) cream Apply 1 application topically as needed. 30 g 0   Multiple Vitamin (MULTI-VITAMIN) tablet Take 1 tablet by mouth daily.     ondansetron (ZOFRAN) 8 MG tablet Take 1 tablet (8 mg total) by mouth 2 (two) times daily as needed for refractory nausea / vomiting. (Patient not taking: No sig reported) 60 tablet 1   PARoxetine (PAXIL) 20 MG tablet Take 30 mg by mouth daily.     prochlorperazine (COMPAZINE) 10 MG tablet TAKE 1 TABLET BY MOUTH EVERY 6 HOURS AS NEEDED FOR NAUSEA OR VOMITING 60 tablet 1   No current facility-administered medications for this visit.   Facility-Administered Medications Ordered in Other Visits  Medication Dose Route Frequency Provider Last Rate Last Admin   heparin lock flush 100 unit/mL  500 Units Intravenous Once [name], MD       sodium chloride flush (NS) 0.9 % injection 10 mL  10 mL Intravenous PRN [name], MD   10 mL  at 03/22/21 0851    OBJECTIVE: Vitals:   03/22/21 0859  BP: (!) 100/58  Resp: 17  Temp: (!) 96.8 F (36 C)     Body mass index is 26.55 kg/m.    ECOG FS:2 - Symptomatic, <50% confined to bed  General: Well-developed, well-nourished, no acute distress. Eyes: Pink conjunctiva, anicteric sclera. HEENT: Normocephalic, moist mucous membranes. Breast: Right breast with increased bruising, but no palpable lumps or masses. Lungs: No audible wheezing or coughing. Heart: Regular rate and rhythm. Abdomen: Soft, nontender, no obvious distention. Musculoskeletal: No edema, cyanosis, or clubbing. Neuro: Alert, answering all questions appropriately. Cranial nerves grossly intact. Skin: No rashes or petechiae noted. Psych: Normal affect.  LAB RESULTS:  Lab Results  Component Value Date   NA 135 03/22/2021  K 3.5 03/22/2021   CL 98 03/22/2021   CO2 28 03/22/2021   GLUCOSE 149 (H) 03/22/2021   BUN 32 (H) 03/22/2021   CREATININE 1.04 (H) 03/22/2021   CALCIUM 9.8 03/22/2021   PROT 6.9 03/22/2021   ALBUMIN 3.3 (L) 03/22/2021   AST 21 03/22/2021   ALT 25 03/22/2021   ALKPHOS 53 03/22/2021   BILITOT 0.8 03/22/2021   GFRNONAA 57 (L) 03/22/2021    Lab Results  Component Value Date   WBC 12.4 (H) 03/22/2021   NEUTROABS 11.1 (H) 03/22/2021   HGB 11.2 (L) 03/22/2021   HCT 34.5 (L) 03/22/2021   MCV 90.6 03/22/2021   PLT 344 03/22/2021     STUDIES: CT Angio Chest PE W and/or Wo Contrast  Result Date: 02/27/2021 CLINICAL DATA:  Chest pain and shortness of breath, wheezing EXAM: CT ANGIOGRAPHY CHEST WITH CONTRAST TECHNIQUE: Multidetector CT imaging of the chest was performed using the standard protocol during bolus administration of intravenous contrast. Multiplanar CT image reconstructions and MIPs were obtained to evaluate the vascular anatomy. CONTRAST:  59m OMNIPAQUE IOHEXOL 350 MG/ML SOLN COMPARISON:  None. FINDINGS: Cardiovascular: Thoracic aortic atherosclerosis without acute  aneurysm or dissection. Patent 3 vessel arch anatomy. Pulmonary arteries appear patent without acute filling defect or pulmonary embolus. Segmental pulmonary branches to the right middle lobe are compressed by the right middle lobe mass described below. Normal heart size. Native coronary atherosclerosis noted. No pericardial effusion. Mediastinum/Nodes: Bulky right paratracheal adenopathy measures 6 x 5.3 cm. Enlarged central left AP window lymph node measures 2.8 x 3.1 cm. Subcarinal adenopathy measures 3.2 x 2.9 cm. These are compatible with mediastinal metastatic adenopathy. Suspect additional ill-defined and difficult to measure right hilar adenopathy. Lungs/Pleura: Large central bulky right middle lobe mass measures approximately 7 x 4.7 x 6 cm. Mass does appear to invade into the right hilum with encasement of the central bronchial structures and abutting against the left atrium and right inferior pulmonary vein. 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 There is complete collapse or invasion of the right middle lobe bronchi. Bronchus intermedius also significantly encased and narrowed proximally. Central right lung surrounding interstitial micro nodularity suggesting right lung regional lymphangitic tumor spread in the right middle lobe and right upper lobe. Mild nonspecific right upper lobe patchy peribronchovascular airspace opacities. Left lung remains clear. Small non loculated right pleural effusion with associated right basilar subsegmental atelectasis. Upper Abdomen: Enlarged upper abdomen gastric hepatic ligament lymph node measures 1.6 cm, image 82/4. No acute upper abdominal finding. Abdominal atherosclerosis noted. Musculoskeletal: Degenerative changes of the spine. No acute osseous finding. No osseous lytic or blastic disease. Sternum intact. Review of the MIP images confirms the above findings. IMPRESSION: Large bulky central right middle lobe mass measuring up to 7 cm  compatible with a primary bronchogenic carcinoma with evidence of bulky metastatic mediastinal adenopathy and suspect surrounding right middle lobe and right upper lobe lymphangitic tumor spread. Also enlarged upper abdominal gastrohepatic lymph nodes suspicious for metastatic disease. Associated trace right pleural effusion and right basilar atelectasis. Negative for significant acute pulmonary embolus. Aortic Atherosclerosis (ICD10-I70.0). These results were called by telephone at the time of interpretation on 02/27/2021 at 11:28 am to provider Dr. EAlwyn Ren who verbally acknowledged these results. Electronically Signed   By: MJerilynn Mages  Shick M.D.   On: 02/27/2021 11:30   MR BRAIN W WO CONTRAST  Result Date: 02/28/2021 CLINICAL DATA:  Lung cancer, staging EXAM: MRI HEAD WITHOUT AND WITH CONTRAST TECHNIQUE: Multiplanar, multiecho pulse sequences  of the brain and surrounding structures were obtained without and with intravenous contrast. CONTRAST:  37m GADAVIST GADOBUTROL 1 MMOL/ML IV SOLN COMPARISON:  None. FINDINGS: Brain: There is no acute infarction or intracranial hemorrhage. There is no intracranial mass, mass effect, or edema. There is no hydrocephalus or extra-axial fluid collection. Ventricles and sulci are within normal limits in size and configuration. Patchy and confluent areas of T2 hyperintensity in the supratentorial and pontine white matter are nonspecific may reflect mild to moderate chronic microvascular ischemic changes. No abnormal enhancement. Vascular: Major vessel flow voids at the skull base are preserved. Skull and upper cervical spine: Normal marrow signal is preserved. Sinuses/Orbits: Paranasal sinuses are aerated. Orbits are unremarkable. Other: Sella is unremarkable.  Mastoid air cells are clear. IMPRESSION: No evidence of intracranial metastatic disease. Chronic microvascular ischemic changes. Electronically Signed   By: PMacy MisM.D.   On: 02/28/2021 20:57   PERIPHERAL VASCULAR  CATHETERIZATION  Result Date: 03/13/2021 See surgical note for result.  NM PET Image Initial (PI) Skull Base To Thigh  Result Date: 03/14/2021 CLINICAL DATA:  Initial treatment strategy for right lung mass and adenopathy. EXAM: NUCLEAR MEDICINE PET SKULL BASE TO THIGH TECHNIQUE: 8.1 mCi F-18 FDG was injected intravenously. Full-ring PET imaging was performed from the skull base to thigh after the radiotracer. CT data was obtained and used for attenuation correction and anatomic localization. Fasting blood glucose: 105 mg/dl COMPARISON:  Chest CT 02/27/2021 FINDINGS: Mediastinal blood pool activity: SUV max 2.51 Liver activity: SUV max NA NECK: No hypermetabolic lymph nodes in the neck. Incidental CT findings: none CHEST: Large right middle lobe lung mass is hypermetabolic with SUV max of 133.29 This is invading the right hilum and mediastinum. Extensive right paratracheal adenopathy with SUV max of 24.41. There is also contralateral AP window adenopathy. Subcarinal adenopathy has an SUV max of 17.69. Small right pleural effusion without hypermetabolic pleural nodules. Few patchy E ill-defined sub solid nodular opacities appear relatively stable. No definite hypermetabolism but recommend CT surveillance. There are several small bilateral supraclavicular nodes which are weakly hypermetabolic. SUV max ranging between 2.5 and 2.6. Incidental CT findings: Vascular calcifications. ABDOMEN/PELVIS: No abnormal hypermetabolic activity within the liver, pancreas, adrenal glands, or spleen. No hypermetabolic lymph nodes in the abdomen or pelvis. Incidental CT findings: Left pelvic kidney is noted with moderate hydronephrosis. SKELETON: No findings suspicious for osseous metastatic disease. Incidental CT findings: none IMPRESSION: 1. Large right middle lobe lung mass is hypermetabolic and consistent with neoplasm. 2. Extensive invasion/involvement of the right hilum and mediastinum. 3. Patchy ground-glass nodules in both  lungs will require CT surveillance. 4. Small bilateral supraclavicular nodes are weakly hypermetabolic and suspicious for metastatic adenopathy. 5. No findings to suggest abdominal/pelvic metastatic disease or osseous metastatic disease. Electronically Signed   By: PMarijo SanesM.D.   On: 03/14/2021 15:48   ECHOCARDIOGRAM COMPLETE  Result Date: 03/15/2021    ECHOCARDIOGRAM REPORT   Patient Name:   LKAMIYAH KINDELDate of Exam: 03/14/2021 Medical Rec #:  [id number]       Height:       61.0 in Accession #:    [id number]      Weight:       149.0 lb Date of Birth:  [birth year]       BSA:          1.667 m Patient Age:    89years         BP:  130/64 mmHg Patient Gender: F                HR:           95 bpm. Exam Location:  [place] Procedure: 2D Echo, Color Doppler, Cardiac Doppler and Intracardiac            Opacification Agent Indications:    I48.91 Atrial fibrillation  History:        Patient has no prior history of Echocardiogram examinations.                 Risk Factors:Hypertension, Dyslipidemia and Former Smoker.  Sonographer:    Charmayne Sheer Referring Phys: [id number] BRIAN AGBOR-ETANG  Sonographer Comments: Technically difficult study due to poor echo windows. Image acquisition challenging due to patient body habitus. IMPRESSIONS  1. Left ventricular ejection fraction, by estimation, is >55%. The left ventricle has normal function. Left ventricular endocardial border not optimally defined to evaluate regional wall motion. There is mild left ventricular hypertrophy. Left ventricular diastolic parameters are consistent with Grade I diastolic dysfunction (impaired relaxation).  2. Right ventricular systolic function is normal. The right ventricular size is normal. Mildly increased right ventricular wall thickness.  3. The mitral valve was not well visualized. No evidence of mitral valve regurgitation. No evidence of mitral stenosis.  4. Tricuspid valve regurgitation is mild to moderate.  5. The aortic  valve was not well visualized. Aortic valve regurgitation is not visualized. No aortic stenosis is present.  6. The inferior vena cava is normal in size with greater than 50% respiratory variability, suggesting right atrial pressure of 3 mmHg. FINDINGS  Left Ventricle: Left ventricular ejection fraction, by estimation, is >55%. The left ventricle has normal function. Left ventricular endocardial border not optimally defined to evaluate regional wall motion. Definity contrast agent was given IV to delineate the left ventricular endocardial borders. The left ventricular internal cavity size was normal in size. There is mild left ventricular hypertrophy. Left ventricular diastolic parameters are consistent with Grade I diastolic dysfunction (impaired relaxation). Right Ventricle: The right ventricular size is normal. Mildly increased right ventricular wall thickness. Right ventricular systolic function is normal. Left Atrium: Left atrial size was normal in size. Right Atrium: Right atrial size was not well visualized. Pericardium: Trivial pericardial effusion is present. Mitral Valve: The mitral valve was not well visualized. Mild mitral annular calcification. No evidence of mitral valve regurgitation. No evidence of mitral valve stenosis. MV peak gradient, 2.4 mmHg. The mean mitral valve gradient is 1.0 mmHg. Tricuspid Valve: The tricuspid valve is not well visualized. Tricuspid valve regurgitation is mild to moderate. Aortic Valve: The aortic valve was not well visualized. Aortic valve regurgitation is not visualized. No aortic stenosis is present. Aortic valve mean gradient measures 3.0 mmHg. Aortic valve peak gradient measures 6.2 mmHg. Aortic valve area, by VTI measures 2.10 cm. Pulmonic Valve: The pulmonic valve was not well visualized. Pulmonic valve regurgitation is not visualized. No evidence of pulmonic stenosis. Aorta: The aortic root is normal in size and structure. Pulmonary Artery: The pulmonary artery  is not well seen. Venous: The inferior vena cava is normal in size with greater than 50% respiratory variability, suggesting right atrial pressure of 3 mmHg. IAS/Shunts: The interatrial septum was not well visualized.  LEFT VENTRICLE PLAX 2D LVIDd:         3.15 cm  Diastology LVIDs:         2.20 cm  LV e' medial:    5.33 cm/s LV PW:  1.10 cm  LV E/e' medial:  8.7 LV IVS:        0.95 cm  LV e' lateral:   6.53 cm/s LVOT diam:     1.90 cm  LV E/e' lateral: 7.1 LV SV:         39 LV SV Index:   24 LVOT Area:     2.84 cm  RIGHT VENTRICLE RV Basal diam:  3.00 cm LEFT ATRIUM           Index LA diam:      2.40 cm 1.44 cm/m LA Vol (A4C): 11.0 ml 6.60 ml/m  AORTIC VALVE                   PULMONIC VALVE AV Area (Vmax):    2.10 cm    PV Vmax:       0.94 m/s AV Area (Vmean):   2.03 cm    PV Vmean:      64.600 cm/s AV Area (VTI):     2.10 cm    PV VTI:        0.145 m AV Vmax:           124.00 cm/s PV Peak grad:  3.5 mmHg AV Vmean:          81.700 cm/s PV Mean grad:  2.0 mmHg AV VTI:            0.188 m AV Peak Grad:      6.2 mmHg AV Mean Grad:      3.0 mmHg LVOT Vmax:         92.00 cm/s LVOT Vmean:        58.600 cm/s LVOT VTI:          0.139 m LVOT/AV VTI ratio: 0.74  AORTA Ao Root diam: 2.70 cm MITRAL VALVE MV Area (PHT): 6.07 cm    SHUNTS MV Area VTI:   3.66 cm    Systemic VTI:  0.14 m MV Peak grad:  2.4 mmHg    Systemic Diam: 1.90 cm MV Mean grad:  1.0 mmHg MV Vmax:       0.78 m/s MV Vmean:      45.4 cm/s MV Decel Time: 125 msec MV E velocity: 46.30 cm/s MV A velocity: 84.80 cm/s MV E/A ratio:  0.55 Harrell Gave End MD Electronically signed by Nelva Bush MD Signature Date/Time: 03/15/2021/1:02:31 PM    Final     ASSESSMENT: Stage IIIb lung cancer.  PLAN:    Stage IIIb lung cancer: CT scan results from February 27, 2021 reviewed independently and reported as above with bulky 7 cm right middle lobe mass invading right hilum with bulky mediastinal lymphadenopathy highly suspicious for underlying bronchogenic  malignancy.  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  MRI of the brain on February 28, 2021 reviewed independently with no obvious metastatic disease.  PET scan on March 14, 2021 confirmed staging with no obvious evidence of metastatic disease.  Patient has now had port placement.  Proceed with cycle 2 of weekly carboplatinum and Taxol today.  Plan to do 6-8 cycles and then transition to maintenance durvalumab for 1 year.  Continue daily XRT.  Return to clinic in 1 week for further evaluation and consideration of cycle 3.    Atrial fibrillation: Continue Cardizem and Eliquis.  Patient also will have a Holter monitor placed for the next several weeks.  Appreciate cardiology input. Anxiety: Continue Xanax as prescribed. Shortness of breath, declining performance status: Patient has  home health.  Appreciate palliative care input. Right breast bruising: Possible tracking from recent port placement.  Patient reports she has not had a screening mammogram in approximately 20 years.  This has been scheduled.  Patient expressed understanding and was in agreement with this plan. She also understands that She can call clinic at any time with any questions, concerns, or complaints.   Cancer Staging Primary cancer of right middle lobe of lung [facility]) Staging form: Lung, AJCC 8th Edition - Clinical stage from 03/07/2021: Stage IIIB (cT4, cN2, cM0) - Signed by [name], MD on 03/07/2021 Stage prefix: Initial diagnosis  [name], MD   03/22/2021 9:33 AM

## 2021-03-20 ENCOUNTER — Ambulatory Visit
Admission: RE | Admit: 2021-03-20 | Discharge: 2021-03-20 | Disposition: A | Discharge status: Home / Self Care | Payer: Medicare Other | Source: Ambulatory Visit | Attending: Radiation Oncology | Admitting: Radiation Oncology

## 2021-03-20 DIAGNOSIS — R918 Other nonspecific abnormal finding of lung field: Secondary | ICD-10-CM | POA: Diagnosis not present

## 2021-03-21 ENCOUNTER — Ambulatory Visit
Admission: RE | Admit: 2021-03-21 | Discharge: 2021-03-21 | Disposition: A | Discharge status: Home / Self Care | Payer: Medicare Other | Source: Ambulatory Visit | Attending: Radiation Oncology | Admitting: Radiation Oncology

## 2021-03-21 ENCOUNTER — Other Ambulatory Visit: Payer: Self-pay | Admitting: *Deleted

## 2021-03-21 DIAGNOSIS — R918 Other nonspecific abnormal finding of lung field: Secondary | ICD-10-CM | POA: Diagnosis not present

## 2021-03-21 MED ORDER — FLUCONAZOLE 150 MG PO TABS: 150.0000 mg | ORAL_TABLET | Freq: Every day | ORAL | 0 refills | Status: AC

## 2021-03-22 ENCOUNTER — Other Ambulatory Visit: Payer: Self-pay

## 2021-03-22 ENCOUNTER — Inpatient Hospital Stay: Payer: Medicare Other

## 2021-03-22 ENCOUNTER — Inpatient Hospital Stay (HOSPITAL_BASED_OUTPATIENT_CLINIC_OR_DEPARTMENT_OTHER): Payer: Medicare Other | Admitting: Oncology

## 2021-03-22 ENCOUNTER — Encounter: Payer: Self-pay | Admitting: Oncology

## 2021-03-22 ENCOUNTER — Ambulatory Visit
Admission: RE | Admit: 2021-03-22 | Discharge: 2021-03-22 | Disposition: A | Discharge status: Home / Self Care | Payer: Medicare Other | Source: Ambulatory Visit | Attending: Radiation Oncology | Admitting: Radiation Oncology

## 2021-03-22 VITALS — BP 100/58 | Temp 96.8°F | Resp 17 | Wt 140.5 lb

## 2021-03-22 DIAGNOSIS — C342 Malignant neoplasm of middle lobe, bronchus or lung: Secondary | ICD-10-CM

## 2021-03-22 DIAGNOSIS — R918 Other nonspecific abnormal finding of lung field: Secondary | ICD-10-CM | POA: Diagnosis not present

## 2021-03-22 LAB — CBC WITH DIFFERENTIAL/PLATELET
Abs Immature Granulocytes: 0.17 10*3/uL — ABNORMAL HIGH (ref 0.00–0.07)
Basophils Absolute: 0.1 10*3/uL (ref 0.0–0.1)
Basophils Relative: 0 %
Eosinophils Absolute: 0 10*3/uL (ref 0.0–0.5)
Eosinophils Relative: 0 %
HCT: 34.5 % — ABNORMAL LOW (ref 36.0–46.0)
Hemoglobin: 11.2 g/dL — ABNORMAL LOW (ref 12.0–15.0)
Immature Granulocytes: 1 %
Lymphocytes Relative: 4 %
Lymphs Abs: 0.4 10*3/uL — ABNORMAL LOW (ref 0.7–4.0)
MCH: 29.4 pg (ref 26.0–34.0)
MCHC: 32.5 g/dL (ref 30.0–36.0)
MCV: 90.6 fL (ref 80.0–100.0)
Monocytes Absolute: 0.6 10*3/uL (ref 0.1–1.0)
Monocytes Relative: 5 %
Neutro Abs: 11.1 10*3/uL — ABNORMAL HIGH (ref 1.7–7.7)
Neutrophils Relative %: 90 %
Platelets: 344 10*3/uL (ref 150–400)
RBC: 3.81 MIL/uL — ABNORMAL LOW (ref 3.87–5.11)
RDW: 13.3 % (ref 11.5–15.5)
WBC: 12.4 10*3/uL — ABNORMAL HIGH (ref 4.0–10.5)
nRBC: 0 % (ref 0.0–0.2)

## 2021-03-22 LAB — COMPREHENSIVE METABOLIC PANEL
ALT: 25 U/L (ref 0–44)
AST: 21 U/L (ref 15–41)
Albumin: 3.3 g/dL — ABNORMAL LOW (ref 3.5–5.0)
Alkaline Phosphatase: 53 U/L (ref 38–126)
Anion gap: 9 (ref 5–15)
BUN: 32 mg/dL — ABNORMAL HIGH (ref 8–23)
CO2: 28 mmol/L (ref 22–32)
Calcium: 9.8 mg/dL (ref 8.9–10.3)
Chloride: 98 mmol/L (ref 98–111)
Creatinine, Ser: 1.04 mg/dL — ABNORMAL HIGH (ref 0.44–1.00)
GFR, Estimated: 57 mL/min — ABNORMAL LOW (ref >60)
Glucose, Bld: 149 mg/dL — ABNORMAL HIGH (ref 70–99)
Potassium: 3.5 mmol/L (ref 3.5–5.1)
Sodium: 135 mmol/L (ref 135–145)
Total Bilirubin: 0.8 mg/dL (ref 0.3–1.2)
Total Protein: 6.9 g/dL (ref 6.5–8.1)

## 2021-03-22 MED ORDER — FAMOTIDINE 20 MG IN NS 100 ML IVPB
20.0000 mg | Freq: Once | INTRAVENOUS | Status: AC
  Administered 2021-03-22: 20 mg via INTRAVENOUS
  Filled 2021-03-22: qty 20

## 2021-03-22 MED ORDER — SODIUM CHLORIDE 0.9% FLUSH
10.0000 mL | INTRAVENOUS | Status: DC | PRN
  Administered 2021-03-22: 10 mL via INTRAVENOUS
  Filled 2021-03-22: qty 10

## 2021-03-22 MED ORDER — HEPARIN SOD (PORK) LOCK FLUSH 100 UNIT/ML IV SOLN
500.0000 [IU] | Freq: Once | INTRAVENOUS | Status: AC
  Filled 2021-03-22: qty 5

## 2021-03-22 MED ORDER — HEPARIN SOD (PORK) LOCK FLUSH 100 UNIT/ML IV SOLN
INTRAVENOUS | Status: AC
  Administered 2021-03-22: 500 [IU] via INTRAVENOUS
  Filled 2021-03-22: qty 5

## 2021-03-22 MED ORDER — SODIUM CHLORIDE 0.9 % IV SOLN
10.0000 mg | Freq: Once | INTRAVENOUS | Status: AC
  Administered 2021-03-22: 10 mg via INTRAVENOUS
  Filled 2021-03-22: qty 10

## 2021-03-22 MED ORDER — SODIUM CHLORIDE 0.9 % IV SOLN: 155.0000 mg | Freq: Once | INTRAVENOUS | Status: DC

## 2021-03-22 MED ORDER — SODIUM CHLORIDE 0.9 % IV SOLN
45.0000 mg/m2 | Freq: Once | INTRAVENOUS | Status: AC
  Administered 2021-03-22: 78 mg via INTRAVENOUS
  Filled 2021-03-22: qty 13

## 2021-03-22 MED ORDER — SODIUM CHLORIDE 0.9 % IV SOLN
145.8000 mg | Freq: Once | INTRAVENOUS | Status: AC
  Administered 2021-03-22: 150 mg via INTRAVENOUS
  Filled 2021-03-22: qty 15

## 2021-03-22 MED ORDER — PALONOSETRON HCL INJECTION 0.25 MG/5ML
0.2500 mg | Freq: Once | INTRAVENOUS | Status: AC
  Administered 2021-03-22: 0.25 mg via INTRAVENOUS
  Filled 2021-03-22: qty 5

## 2021-03-22 MED ORDER — SODIUM CHLORIDE 0.9 % IV SOLN
Freq: Once | INTRAVENOUS | Status: AC
  Filled 2021-03-22: qty 250

## 2021-03-22 MED ORDER — DIPHENHYDRAMINE HCL 50 MG/ML IJ SOLN
25.0000 mg | Freq: Once | INTRAMUSCULAR | Status: AC
  Administered 2021-03-22: 25 mg via INTRAVENOUS
  Filled 2021-03-22: qty 1

## 2021-03-22 NOTE — Progress Notes (Signed)
Nutrition Assessment:  Referral from [place], NP poor appetite  75 year old female with lung cancer.  Receiving concurrent chemotherapy and radiation.  Past medical history of anxiety, asthma, HTN, HLD, pre-diabetes  Met with patient during infusion.  Patient reports that she has oral thrush in her mouth and started medication (diflucan). Reports that her mouth is improving.  Patient has been eating soups, fairlife shakes mixed with fruit and granola blended into shake.  Planning Mom Meals from insurance to be delivered today for 14 meals.    Patient coughing during visit.    Medications: diflucan, MVI, zofran, compazine  Labs: glucose 149, BUN 32, creatinine 1.04  Anthropometrics:   Weight 140 lb 8 oz today  156 lb on 8/10 149 lb on 8/8 152 lb on 8/2 149 lb on 7/26  10% weight loss in the last week ?? Accurancy)   Estimated Energy Needs  Kcals: 1600-1900 Protein: 80-95 g Fluid: 1.6L  NUTRITION DIAGNOSIS: Inadequate oral intake related to oral thrush, cancer and cancer related treatment side effects as evidenced by weight loss and poor appetite   INTERVENTION:  Discussed importance of including high protein foods in diet. Discussed oral nutrition supplements and encouraged 350 calorie shake or higher. Samples of orgain, boost plus and ensure complete, carnation breakfast essentials packet given to patient.  Contact information given     MONITORING, EVALUATION, GOAL: weight trends, intake   NEXT VISIT: Wednesday, August 24 during infusion  Javion Holmer B. Zenia Resides, [place], [place] Registered Dietitian [phone] (mobile)

## 2021-03-22 NOTE — Addendum Note (Signed)
Addended by: Vanice Sarah on: 03/22/2021 10:01 AM   Modules accepted: Orders

## 2021-03-22 NOTE — Patient Instructions (Signed)
[facility] [place] [facility]  Discharge Instructions: Thank you for choosing [facility] to provide your oncology and hematology care.  If you have a lab appointment with the Cancer Center, please go directly to the Cancer Center and check in at the registration area.  Wear comfortable clothing and clothing appropriate for easy access to any Portacath or PICC line.   We strive to give you quality time with your provider. You may need to reschedule your appointment if you arrive late (15 or more minutes).  Arriving late affects you and other patients whose appointments are after yours.  Also, if you miss three or more appointments without notifying the office, you may be dismissed from the clinic at the provider's discretion.      For prescription refill requests, have your pharmacy contact our office and allow 72 hours for refills to be completed.    Today you received the following chemotherapy and/or immunotherapy agents Taxol & Carboplatin      To help prevent nausea and vomiting after your treatment, we encourage you to take your nausea medication as directed.  BELOW ARE SYMPTOMS THAT SHOULD BE REPORTED IMMEDIATELY: *FEVER GREATER THAN 100.4 F (38 C) OR HIGHER *CHILLS OR SWEATING *NAUSEA AND VOMITING THAT IS NOT CONTROLLED WITH YOUR NAUSEA MEDICATION *UNUSUAL SHORTNESS OF BREATH *UNUSUAL BRUISING OR BLEEDING *URINARY PROBLEMS (pain or burning when urinating, or frequent urination) *BOWEL PROBLEMS (unusual diarrhea, constipation, pain near the anus) TENDERNESS IN MOUTH AND THROAT WITH OR WITHOUT PRESENCE OF ULCERS (sore throat, sores in mouth, or a toothache) UNUSUAL RASH, SWELLING OR PAIN  UNUSUAL VAGINAL DISCHARGE OR ITCHING   Items with * indicate a potential emergency and should be followed up as soon as possible or go to the Emergency Department if any problems should occur.  Please show the CHEMOTHERAPY ALERT CARD or IMMUNOTHERAPY ALERT CARD at  check-in to the Emergency Department and triage nurse.  Should you have questions after your visit or need to cancel or reschedule your appointment, please contact [facility]  [phone] and follow the prompts.  Office hours are 8:00 a.m. to 4:30 p.m. Monday - Friday. Please note that voicemails left after 4:00 p.m. may not be returned until the following business day.  We are closed weekends and major holidays. You have access to a nurse at all times for urgent questions. Please call the main number to the clinic [phone] and follow the prompts.  For any non-urgent questions, you may also contact your provider using MyChart. We now offer e-Visits for anyone 18 and older to request care online for non-urgent symptoms. For details visit [email or web address].   Also download the MyChart app! Go to the app store, search "MyChart", open the app, select [facility], and log in with your MyChart username and password.  Due to Covid, a mask is required upon entering the hospital/clinic. If you do not have a mask, one will be given to you upon arrival. For doctor visits, patients may have 1 support person aged 18 or older with them. For treatment visits, patients cannot have anyone with them due to current Covid guidelines and our immunocompromised population.  

## 2021-03-23 ENCOUNTER — Ambulatory Visit
Admission: RE | Admit: 2021-03-23 | Discharge: 2021-03-23 | Disposition: A | Discharge status: Home / Self Care | Payer: Medicare Other | Source: Ambulatory Visit | Attending: Radiation Oncology | Admitting: Radiation Oncology

## 2021-03-23 DIAGNOSIS — R918 Other nonspecific abnormal finding of lung field: Secondary | ICD-10-CM | POA: Diagnosis not present

## 2021-03-24 ENCOUNTER — Ambulatory Visit
Admission: RE | Admit: 2021-03-24 | Discharge: 2021-03-24 | Disposition: A | Discharge status: Home / Self Care | Payer: Medicare Other | Source: Ambulatory Visit | Attending: Radiation Oncology | Admitting: Radiation Oncology

## 2021-03-24 DIAGNOSIS — R918 Other nonspecific abnormal finding of lung field: Secondary | ICD-10-CM | POA: Diagnosis not present

## 2021-03-27 ENCOUNTER — Ambulatory Visit
Admission: RE | Admit: 2021-03-27 | Discharge: 2021-03-27 | Disposition: A | Discharge status: Home / Self Care | Payer: Medicare Other | Source: Ambulatory Visit | Attending: Radiation Oncology | Admitting: Radiation Oncology

## 2021-03-27 ENCOUNTER — Other Ambulatory Visit: Payer: Self-pay | Admitting: *Deleted

## 2021-03-27 DIAGNOSIS — R918 Other nonspecific abnormal finding of lung field: Secondary | ICD-10-CM | POA: Diagnosis not present

## 2021-03-27 MED ORDER — FLUTICASONE PROPIONATE 50 MCG/ACT NA SUSP: 1.0000 | Freq: Two times a day (BID) | NASAL | 1 refills | Status: AC

## 2021-03-28 ENCOUNTER — Ambulatory Visit
Admission: RE | Admit: 2021-03-28 | Discharge: 2021-03-28 | Disposition: A | Discharge status: Home / Self Care | Payer: Medicare Other | Source: Ambulatory Visit | Attending: Radiation Oncology | Admitting: Radiation Oncology

## 2021-03-28 DIAGNOSIS — R918 Other nonspecific abnormal finding of lung field: Secondary | ICD-10-CM | POA: Diagnosis not present

## 2021-03-29 ENCOUNTER — Inpatient Hospital Stay: Payer: Medicare Other

## 2021-03-29 ENCOUNTER — Ambulatory Visit
Admission: RE | Admit: 2021-03-29 | Discharge: 2021-03-29 | Disposition: A | Discharge status: Home / Self Care | Payer: Medicare Other | Source: Ambulatory Visit | Attending: Oncology | Admitting: Oncology

## 2021-03-29 ENCOUNTER — Ambulatory Visit
Admission: RE | Admit: 2021-03-29 | Discharge: 2021-03-29 | Disposition: A | Discharge status: Home / Self Care | Payer: Medicare Other | Source: Ambulatory Visit | Attending: Radiation Oncology | Admitting: Radiation Oncology

## 2021-03-29 ENCOUNTER — Encounter: Payer: Self-pay | Admitting: Oncology

## 2021-03-29 ENCOUNTER — Inpatient Hospital Stay (HOSPITAL_BASED_OUTPATIENT_CLINIC_OR_DEPARTMENT_OTHER): Payer: Medicare Other | Admitting: Oncology

## 2021-03-29 VITALS — BP 114/73 | HR 81 | Temp 97.1°F | Resp 16 | Wt 148.8 lb

## 2021-03-29 DIAGNOSIS — R918 Other nonspecific abnormal finding of lung field: Secondary | ICD-10-CM | POA: Diagnosis not present

## 2021-03-29 DIAGNOSIS — C342 Malignant neoplasm of middle lobe, bronchus or lung: Secondary | ICD-10-CM

## 2021-03-29 DIAGNOSIS — M7989 Other specified soft tissue disorders: Secondary | ICD-10-CM | POA: Insufficient documentation

## 2021-03-29 LAB — CBC WITH DIFFERENTIAL/PLATELET
Abs Immature Granulocytes: 0.14 10*3/uL — ABNORMAL HIGH (ref 0.00–0.07)
Basophils Absolute: 0 10*3/uL (ref 0.0–0.1)
Basophils Relative: 0 %
Eosinophils Absolute: 0 10*3/uL (ref 0.0–0.5)
Eosinophils Relative: 0 %
HCT: 31.1 % — ABNORMAL LOW (ref 36.0–46.0)
Hemoglobin: 10.5 g/dL — ABNORMAL LOW (ref 12.0–15.0)
Immature Granulocytes: 2 %
Lymphocytes Relative: 3 %
Lymphs Abs: 0.2 10*3/uL — ABNORMAL LOW (ref 0.7–4.0)
MCH: 30.1 pg (ref 26.0–34.0)
MCHC: 33.8 g/dL (ref 30.0–36.0)
MCV: 89.1 fL (ref 80.0–100.0)
Monocytes Absolute: 0.7 10*3/uL (ref 0.1–1.0)
Monocytes Relative: 8 %
Neutro Abs: 7.7 10*3/uL (ref 1.7–7.7)
Neutrophils Relative %: 87 %
Platelets: 277 10*3/uL (ref 150–400)
RBC: 3.49 MIL/uL — ABNORMAL LOW (ref 3.87–5.11)
RDW: 13.9 % (ref 11.5–15.5)
WBC: 8.8 10*3/uL (ref 4.0–10.5)
nRBC: 0 % (ref 0.0–0.2)

## 2021-03-29 LAB — COMPREHENSIVE METABOLIC PANEL
ALT: 19 U/L (ref 0–44)
AST: 17 U/L (ref 15–41)
Albumin: 2.8 g/dL — ABNORMAL LOW (ref 3.5–5.0)
Alkaline Phosphatase: 58 U/L (ref 38–126)
Anion gap: 10 (ref 5–15)
BUN: 21 mg/dL (ref 8–23)
CO2: 25 mmol/L (ref 22–32)
Calcium: 8.8 mg/dL — ABNORMAL LOW (ref 8.9–10.3)
Chloride: 91 mmol/L — ABNORMAL LOW (ref 98–111)
Creatinine, Ser: 0.99 mg/dL (ref 0.44–1.00)
GFR, Estimated: >60 mL/min (ref >60)
Glucose, Bld: 206 mg/dL — ABNORMAL HIGH (ref 70–99)
Potassium: 3.4 mmol/L — ABNORMAL LOW (ref 3.5–5.1)
Sodium: 126 mmol/L — ABNORMAL LOW (ref 135–145)
Total Bilirubin: 0.6 mg/dL (ref 0.3–1.2)
Total Protein: 6.6 g/dL (ref 6.5–8.1)

## 2021-03-29 MED ORDER — DIPHENHYDRAMINE HCL 50 MG/ML IJ SOLN
25.0000 mg | Freq: Once | INTRAMUSCULAR | Status: AC
  Administered 2021-03-29: 25 mg via INTRAVENOUS
  Filled 2021-03-29: qty 1

## 2021-03-29 MED ORDER — SODIUM CHLORIDE 0.9 % IV SOLN
Freq: Once | INTRAVENOUS | Status: AC
Start: 2021-03-29 — End: 2021-03-29
  Filled 2021-03-29: qty 250

## 2021-03-29 MED ORDER — HEPARIN SOD (PORK) LOCK FLUSH 100 UNIT/ML IV SOLN
INTRAVENOUS | Status: AC
  Filled 2021-03-29: qty 5

## 2021-03-29 MED ORDER — PALONOSETRON HCL INJECTION 0.25 MG/5ML
0.2500 mg | Freq: Once | INTRAVENOUS | Status: AC
  Administered 2021-03-29: 0.25 mg via INTRAVENOUS
  Filled 2021-03-29: qty 5

## 2021-03-29 MED ORDER — SODIUM CHLORIDE 0.9% FLUSH
10.0000 mL | Freq: Once | INTRAVENOUS | Status: AC
  Administered 2021-03-29: 10 mL via INTRAVENOUS
  Filled 2021-03-29: qty 10

## 2021-03-29 MED ORDER — SODIUM CHLORIDE 0.9 % IV SOLN
45.0000 mg/m2 | Freq: Once | INTRAVENOUS | Status: AC
  Administered 2021-03-29: 78 mg via INTRAVENOUS
  Filled 2021-03-29: qty 13

## 2021-03-29 MED ORDER — HEPARIN SOD (PORK) LOCK FLUSH 100 UNIT/ML IV SOLN
500.0000 [IU] | Freq: Once | INTRAVENOUS | Status: AC
  Administered 2021-03-29: 500 [IU] via INTRAVENOUS
  Filled 2021-03-29: qty 5

## 2021-03-29 MED ORDER — SODIUM CHLORIDE 0.9 % IV SOLN
159.2000 mg | Freq: Once | INTRAVENOUS | Status: AC
  Administered 2021-03-29: 160 mg via INTRAVENOUS
  Filled 2021-03-29: qty 16

## 2021-03-29 MED ORDER — SODIUM CHLORIDE 0.9 % IV SOLN
10.0000 mg | Freq: Once | INTRAVENOUS | Status: AC
  Administered 2021-03-29: 10 mg via INTRAVENOUS
  Filled 2021-03-29: qty 10

## 2021-03-29 MED ORDER — FAMOTIDINE 20 MG IN NS 100 ML IVPB
20.0000 mg | Freq: Once | INTRAVENOUS | Status: AC
  Administered 2021-03-29: 20 mg via INTRAVENOUS
  Filled 2021-03-29: qty 100
  Filled 2021-03-29: qty 20

## 2021-03-29 NOTE — Progress Notes (Signed)
Patient has left arm swelling that started a couple weeks ago and is getting worse.  Feels that she is sleeping all the time.  Has a weight gain of 8 lbs.

## 2021-03-29 NOTE — Progress Notes (Signed)
Nutrition Follow-up:  Patient with lung cancer.  Receiving concurrent chemotherapy and radiation.    Spoke with caregiver and neighbor, Lawyer.  Ginny reports poor po intake for some time now.  Patient was able to eat all of [employer] last night for dinner.  Ginny reports that these meals have been good.  Patient has been drinking gatorade.  [name] reports that patient has taste alterations.      Medications: reviewed  Labs: reviewed  Anthropometrics:   Weight 148 lb (edema as intake poor)  140 lb on 8/17  156 lb on 8/10 149 lb on 8/8 152 lb on 8/2 149 lb on 7/26  NUTRITION DIAGNOSIS:  Inadequate oral intake continues   INTERVENTION:  Discussed strategies for taste change.  Will email handout to Ginny at [email or web address] Discussed high calorie shake options (ensure complete, ensure plus, boost plus) Will email handout on high calorie, high protein diet and snacks.     MONITORING, EVALUATION, GOAL: weight trends, intake   NEXT VISIT: Wednesday, August 31 talk with Talmage Coin B. Zenia Resides, [place], [place] Registered Dietitian [phone] (mobile)

## 2021-03-29 NOTE — Progress Notes (Signed)
Per MD, Dr. [name], order: proceed with scheduled Taxol and Carboplatin treatment today; patient to go for STAT ultrasound post treatment.

## 2021-03-29 NOTE — Patient Instructions (Signed)
[facility]   Discharge Instructions: Thank you for choosing [facility] to provide your oncology and hematology care.  If you have a lab appointment with the [facility], please go directly to the [facility] and check in at the registration area.  Wear comfortable clothing and clothing appropriate for easy access to any Portacath or PICC line.   We strive to give you quality time with your provider. You may need to reschedule your appointment if you arrive late (15 or more minutes).  Arriving late affects you and other patients whose appointments are after yours.  Also, if you miss three or more appointments without notifying the office, you may be dismissed from the clinic at the provider's discretion.      For prescription refill requests, have your pharmacy contact our office and allow 72 hours for refills to be completed.    Today you received the following chemotherapy and/or immunotherapy agents: Taxol, Carboplatin.      To help prevent nausea and vomiting after your treatment, we encourage you to take your nausea medication as directed.  BELOW ARE SYMPTOMS THAT SHOULD BE REPORTED IMMEDIATELY: *FEVER GREATER THAN 100.4 F (38 C) OR HIGHER *CHILLS OR SWEATING *NAUSEA AND VOMITING THAT IS NOT CONTROLLED WITH YOUR NAUSEA MEDICATION *UNUSUAL SHORTNESS OF BREATH *UNUSUAL BRUISING OR BLEEDING *URINARY PROBLEMS (pain or burning when urinating, or frequent urination) *BOWEL PROBLEMS (unusual diarrhea, constipation, pain near the anus) TENDERNESS IN MOUTH AND THROAT WITH OR WITHOUT PRESENCE OF ULCERS (sore throat, sores in mouth, or a toothache) UNUSUAL RASH, SWELLING OR PAIN  UNUSUAL VAGINAL DISCHARGE OR ITCHING   Items with * indicate a potential emergency and should be followed up as soon as possible or go to the Emergency Department if any problems should occur.  Please show the CHEMOTHERAPY ALERT CARD or IMMUNOTHERAPY ALERT CARD  at check-in to the Emergency Department and triage nurse.  Should you have questions after your visit or need to cancel or reschedule your appointment, please contact [facility]  [phone] and follow the prompts.  Office hours are 8:00 a.m. to 4:30 p.m. Monday - Friday. Please note that voicemails left after 4:00 p.m. may not be returned until the following business day.  We are closed weekends and major holidays. You have access to a nurse at all times for urgent questions. Please call the main number to the clinic [phone] and follow the prompts.  For any non-urgent questions, you may also contact your provider using MyChart. We now offer e-Visits for anyone 29 and older to request care online for non-urgent symptoms. For details visit [email or web address].   Also download the MyChart app! Go to the app store, search "MyChart", open the app, select [facility], and log in with your MyChart username and password.  Due to Covid, a mask is required upon entering the hospital/clinic. If you do not have a mask, one will be given to you upon arrival. For doctor visits, patients may have 1 support person aged 38 or older with them. For treatment visits, patients cannot have anyone with them due to current Covid guidelines and our immunocompromised population.

## 2021-03-29 NOTE — Progress Notes (Signed)
[place]  [phone] [phone]) [phone]  ID: Oneita Kras OB: 1946-06-20  MR#: [id number]  JOI#:[id number]  Patient Care Team: [name], MD as PCP - General (Family Medicine) [name], MD as PCP - Cardiology (Cardiology) [name], RN as Oncology Nurse Navigator  CHIEF COMPLAINT:Stage IIIb lung cancer.  INTERVAL HISTORY: Patient returns to clinic today for further evaluation and consideration of cycle 3 of weekly carboplatin and Taxol.  She reports increasing weakness and fatigue and a poor appetite.  She also has increased left arm swelling. She does not complain of pain.  She has no neurologic complaints.  She denies any recent fevers.  She has no chest pain, shortness of breath, cough, or hemoptysis.  She has no nausea, vomiting, constipation, or diarrhea.  She has no urinary complaints.  Patient offers no further specific complaints today.  REVIEW OF SYSTEMS:   Review of Systems  Constitutional:  Positive for malaise/fatigue. Negative for fever and weight loss.  Respiratory: Negative.  Negative for cough, hemoptysis and shortness of breath.   Cardiovascular: Negative.  Negative for chest pain and leg swelling.  Gastrointestinal: Negative.  Negative for abdominal pain and nausea.  Genitourinary: Negative.  Negative for dysuria.  Musculoskeletal: Negative.  Negative for back pain.  Skin: Negative.  Negative for itching and rash.  Neurological:  Positive for weakness. Negative for dizziness, focal weakness and headaches.  Psychiatric/Behavioral: Negative.  The patient is not nervous/anxious.    As per HPI. Otherwise, a complete review of systems is negative.  PAST MEDICAL HISTORY: Past Medical History:  Diagnosis Date   Anxiety    Arthritis    Asthma    Cancer ([place])    benign mass removed   Complication of anesthesia    Dyspnea    Dysrhythmia    Hyperlipidemia    Hypertension    Hypothyroidism    PONV  (postoperative nausea and vomiting)    Pre-diabetes    Thyroid disease     PAST SURGICAL HISTORY: Past Surgical History:  Procedure Laterality Date   COLONOSCOPY     FLEXIBLE BRONCHOSCOPY Bilateral 03/06/2021   Procedure: FLEXIBLE BRONCHOSCOPY;  Surgeon: [name], MD;  Location: [facility];  Service: Pulmonary;  Laterality: Bilateral;   PORTA CATH INSERTION N/A 03/13/2021   Procedure: PORTA CATH INSERTION;  Surgeon: [name], MD;  Location: [facility];  Service: Cardiovascular;  Laterality: N/A;   stent placed in kidney     THYROIDECTOMY      FAMILY HISTORY: Family History  Problem Relation Age of Onset   Mesothelioma Father    Rectal cancer Sister     ADVANCED DIRECTIVES (Y/N):  N  HEALTH MAINTENANCE: Social History   Tobacco Use   Smoking status: Former    Types: Cigarettes    Quit date: 2012    Years since quitting: 10.6   Smokeless tobacco: Never  Vaping Use   Vaping Use: Never used  Substance Use Topics   Alcohol use: Not Currently   Drug use: Never     Colonoscopy:  PAP:  Bone density:  Lipid panel:  Allergies  Allergen Reactions   Atorvastatin Other (See Comments)    Joint pain   Latex    Lovastatin Other (See Comments)    Arthritis pain in hands   Penicillins     Current Outpatient Medications  Medication Sig Dispense Refill   ALPRAZolam (XANAX) 0.25 MG tablet Take 1 tablet (0.25 mg total) by mouth 2 (two) times  daily as needed for anxiety. 60 tablet 0   apixaban (ELIQUIS) 5 MG TABS tablet Take 1 tablet (5 mg total) by mouth 2 (two) times daily. (Patient not taking: Reported on 03/15/2021) 60 tablet 5   Budeson-Glycopyrrol-Formoterol (BREZTRI AEROSPHERE) 160-9-4.8 MCG/ACT AERO Inhale 1 spray into the lungs 2 (two) times daily. 10.7 g 2   diltiazem (CARDIZEM CD) 240 MG 24 hr capsule Take 1 capsule (240 mg total) by mouth daily. To control your heart rate from Afib. 30 capsule 2   ezetimibe (ZETIA) 10 MG tablet TAKE 1 TABLET(10 MG) BY  MOUTH EVERY DAY (Patient not taking: No sig reported)     fluconazole (DIFLUCAN) 150 MG tablet Take 1 tablet (150 mg total) by mouth daily. 7 tablet 0   fluticasone (FLONASE) 50 MCG/ACT nasal spray Place 1 spray into both nostrils in the morning and at bedtime. 15 mL 1   guaiFENesin-dextromethorphan (ROBITUSSIN DM) 100-10 MG/5ML syrup Take 10 mLs by mouth every 6 (six) hours as needed for cough. 118 mL 0   hydrochlorothiazide (HYDRODIURIL) 12.5 MG tablet Take 12.5 mg by mouth daily.     ipratropium (ATROVENT) 0.06 % nasal spray Place 2 sprays into both nostrils 3 (three) times daily.     ipratropium-albuterol (DUONEB) 0.5-2.5 (3) MG/3ML SOLN Take 3 mLs by nebulization every 6 (six) hours as needed. 120 mL 2   levocetirizine (XYZAL) 5 MG tablet Take 5 mg by mouth daily as needed.     levothyroxine (SYNTHROID) 112 MCG tablet Take 112 mcg by mouth daily.     lidocaine-prilocaine (EMLA) cream Apply 1 application topically as needed. 30 g 0   Multiple Vitamin (MULTI-VITAMIN) tablet Take 1 tablet by mouth daily.     ondansetron (ZOFRAN) 8 MG tablet Take 1 tablet (8 mg total) by mouth 2 (two) times daily as needed for refractory nausea / vomiting. (Patient not taking: No sig reported) 60 tablet 1   PARoxetine (PAXIL) 20 MG tablet Take 30 mg by mouth daily.     prochlorperazine (COMPAZINE) 10 MG tablet TAKE 1 TABLET BY MOUTH EVERY 6 HOURS AS NEEDED FOR NAUSEA OR VOMITING 60 tablet 1   No current facility-administered medications for this visit.   Facility-Administered Medications Ordered in Other Visits  Medication Dose Route Frequency Provider Last Rate Last Admin   heparin lock flush 100 unit/mL  500 Units Intravenous Once [name], MD        OBJECTIVE: Vitals:   03/29/21 0846  BP: 114/73  Pulse: 81  Resp: 16  Temp: (!) 97.1 F (36.2 C)     Body mass index is 28.12 kg/m.    ECOG FS:2 - Symptomatic, <50% confined to bed  General: Well-developed, well-nourished, no acute distress.   Sitting in a wheelchair. Eyes: Pink conjunctiva, anicteric sclera. HEENT: Normocephalic, moist mucous membranes. Lungs: No audible wheezing or coughing. Heart: Regular rate and rhythm. Abdomen: Soft, nontender, no obvious distention. Musculoskeletal: No edema, cyanosis, or clubbing.  Left arm edema. Neuro: Alert, answering all questions appropriately. Cranial nerves grossly intact. Skin: No rashes or petechiae noted. Psych: Normal affect.   LAB RESULTS:  Lab Results  Component Value Date   NA 126 (L) 03/29/2021   K 3.4 (L) 03/29/2021   CL 91 (L) 03/29/2021   CO2 25 03/29/2021   GLUCOSE 206 (H) 03/29/2021   BUN 21 03/29/2021   CREATININE 0.99 03/29/2021   CALCIUM 8.8 (L) 03/29/2021   PROT 6.6 03/29/2021   ALBUMIN 2.8 (L) 03/29/2021   AST  17 03/29/2021   ALT 19 03/29/2021   ALKPHOS 58 03/29/2021   BILITOT 0.6 03/29/2021   GFRNONAA >60 03/29/2021    Lab Results  Component Value Date   WBC 8.8 03/29/2021   NEUTROABS 7.7 03/29/2021   HGB 10.5 (L) 03/29/2021   HCT 31.1 (L) 03/29/2021   MCV 89.1 03/29/2021   PLT 277 03/29/2021     STUDIES: CT Angio Chest PE W and/or Wo Contrast  Result Date: 02/27/2021 CLINICAL DATA:  Chest pain and shortness of breath, wheezing EXAM: CT ANGIOGRAPHY CHEST WITH CONTRAST TECHNIQUE: Multidetector CT imaging of the chest was performed using the standard protocol during bolus administration of intravenous contrast. Multiplanar CT image reconstructions and MIPs were obtained to evaluate the vascular anatomy. CONTRAST:  53m OMNIPAQUE IOHEXOL 350 MG/ML SOLN COMPARISON:  None. FINDINGS: Cardiovascular: Thoracic aortic atherosclerosis without acute aneurysm or dissection. Patent 3 vessel arch anatomy. Pulmonary arteries appear patent without acute filling defect or pulmonary embolus. Segmental pulmonary branches to the right middle lobe are compressed by the right middle lobe mass described below. Normal heart size. Native coronary atherosclerosis  noted. No pericardial effusion. Mediastinum/Nodes: Bulky right paratracheal adenopathy measures 6 x 5.3 cm. Enlarged central left AP window lymph node measures 2.8 x 3.1 cm. Subcarinal adenopathy measures 3.2 x 2.9 cm. These are compatible with mediastinal metastatic adenopathy. Suspect additional ill-defined and difficult to measure right hilar adenopathy. Lungs/Pleura: Large central bulky right middle lobe mass measures approximately 7 x 4.7 x 6 cm. Mass does appear to invade into the right hilum with encasement of the central bronchial structures and abutting against the left atrium and right inferior pulmonary vein. 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 There is complete collapse or invasion of the right middle lobe bronchi. Bronchus intermedius also significantly encased and narrowed proximally. Central right lung surrounding interstitial micro nodularity suggesting right lung regional lymphangitic tumor spread in the right middle lobe and right upper lobe. Mild nonspecific right upper lobe patchy peribronchovascular airspace opacities. Left lung remains clear. Small non loculated right pleural effusion with associated right basilar subsegmental atelectasis. Upper Abdomen: Enlarged upper abdomen gastric hepatic ligament lymph node measures 1.6 cm, image 82/4. No acute upper abdominal finding. Abdominal atherosclerosis noted. Musculoskeletal: Degenerative changes of the spine. No acute osseous finding. No osseous lytic or blastic disease. Sternum intact. Review of the MIP images confirms the above findings. IMPRESSION: Large bulky central right middle lobe mass measuring up to 7 cm compatible with a primary bronchogenic carcinoma with evidence of bulky metastatic mediastinal adenopathy and suspect surrounding right middle lobe and right upper lobe lymphangitic tumor spread. Also enlarged upper abdominal gastrohepatic lymph nodes suspicious for metastatic disease. Associated trace right  pleural effusion and right basilar atelectasis. Negative for significant acute pulmonary embolus. Aortic Atherosclerosis (ICD10-I70.0). These results were called by telephone at the time of interpretation on 02/27/2021 at 11:28 am to provider Dr. EAlwyn Ren who verbally acknowledged these results. Electronically Signed   By: MJerilynn Mages  Shick M.D.   On: 02/27/2021 11:30   MR BRAIN W WO CONTRAST  Result Date: 02/28/2021 CLINICAL DATA:  Lung cancer, staging EXAM: MRI HEAD WITHOUT AND WITH CONTRAST TECHNIQUE: Multiplanar, multiecho pulse sequences of the brain and surrounding structures were obtained without and with intravenous contrast. CONTRAST:  667mGADAVIST GADOBUTROL 1 MMOL/ML IV SOLN COMPARISON:  None. FINDINGS: Brain: There is no acute infarction or intracranial hemorrhage. There is no intracranial mass, mass effect, or edema. There is no hydrocephalus or extra-axial fluid collection. Ventricles  and sulci are within normal limits in size and configuration. Patchy and confluent areas of T2 hyperintensity in the supratentorial and pontine white matter are nonspecific may reflect mild to moderate chronic microvascular ischemic changes. No abnormal enhancement. Vascular: Major vessel flow voids at the skull base are preserved. Skull and upper cervical spine: Normal marrow signal is preserved. Sinuses/Orbits: Paranasal sinuses are aerated. Orbits are unremarkable. Other: Sella is unremarkable.  Mastoid air cells are clear. IMPRESSION: No evidence of intracranial metastatic disease. Chronic microvascular ischemic changes. Electronically Signed   By: Macy Mis M.D.   On: 02/28/2021 20:57   PERIPHERAL VASCULAR CATHETERIZATION  Result Date: 03/13/2021 See surgical note for result.  NM PET Image Initial (PI) Skull Base To Thigh  Result Date: 03/14/2021 CLINICAL DATA:  Initial treatment strategy for right lung mass and adenopathy. EXAM: NUCLEAR MEDICINE PET SKULL BASE TO THIGH TECHNIQUE: 8.1 mCi F-18 FDG was  injected intravenously. Full-ring PET imaging was performed from the skull base to thigh after the radiotracer. CT data was obtained and used for attenuation correction and anatomic localization. Fasting blood glucose: 105 mg/dl COMPARISON:  Chest CT 02/27/2021 FINDINGS: Mediastinal blood pool activity: SUV max 2.51 Liver activity: SUV max NA NECK: No hypermetabolic lymph nodes in the neck. Incidental CT findings: none CHEST: Large right middle lobe lung mass is hypermetabolic with SUV max of 32.95. This is invading the right hilum and mediastinum. Extensive right paratracheal adenopathy with SUV max of 24.41. There is also contralateral AP window adenopathy. Subcarinal adenopathy has an SUV max of 17.69. Small right pleural effusion without hypermetabolic pleural nodules. Few patchy E ill-defined sub solid nodular opacities appear relatively stable. No definite hypermetabolism but recommend CT surveillance. There are several small bilateral supraclavicular nodes which are weakly hypermetabolic. SUV max ranging between 2.5 and 2.6. Incidental CT findings: Vascular calcifications. ABDOMEN/PELVIS: No abnormal hypermetabolic activity within the liver, pancreas, adrenal glands, or spleen. No hypermetabolic lymph nodes in the abdomen or pelvis. Incidental CT findings: Left pelvic kidney is noted with moderate hydronephrosis. SKELETON: No findings suspicious for osseous metastatic disease. Incidental CT findings: none IMPRESSION: 1. Large right middle lobe lung mass is hypermetabolic and consistent with neoplasm. 2. Extensive invasion/involvement of the right hilum and mediastinum. 3. Patchy ground-glass nodules in both lungs will require CT surveillance. 4. Small bilateral supraclavicular nodes are weakly hypermetabolic and suspicious for metastatic adenopathy. 5. No findings to suggest abdominal/pelvic metastatic disease or osseous metastatic disease. Electronically Signed   By: Marijo Sanes M.D.   On: 03/14/2021 15:48    ECHOCARDIOGRAM COMPLETE  Result Date: 03/15/2021    ECHOCARDIOGRAM REPORT   Patient Name:   MANMEET ARZOLA Date of Exam: 03/14/2021 Medical Rec #:  [id number]        Height:       61.0 in Accession #:    [id number]       Weight:       149.0 lb Date of Birth:  19-Dec-1945        BSA:          1.667 m Patient Age:    86 years         BP:           130/64 mmHg Patient Gender: F                HR:           95 bpm. Exam Location:  [place] Procedure: 2D Echo, Color Doppler, Cardiac Doppler and Intracardiac  Opacification Agent Indications:    I48.91 Atrial fibrillation  History:        Patient has no prior history of Echocardiogram examinations.                 Risk Factors:Hypertension, Dyslipidemia and Former Smoker.  Sonographer:    Charmayne Sheer Referring Phys: [id number] BRIAN AGBOR-ETANG  Sonographer Comments: Technically difficult study due to poor echo windows. Image acquisition challenging due to patient body habitus. IMPRESSIONS  1. Left ventricular ejection fraction, by estimation, is >55%. The left ventricle has normal function. Left ventricular endocardial border not optimally defined to evaluate regional wall motion. There is mild left ventricular hypertrophy. Left ventricular diastolic parameters are consistent with Grade I diastolic dysfunction (impaired relaxation).  2. Right ventricular systolic function is normal. The right ventricular size is normal. Mildly increased right ventricular wall thickness.  3. The mitral valve was not well visualized. No evidence of mitral valve regurgitation. No evidence of mitral stenosis.  4. Tricuspid valve regurgitation is mild to moderate.  5. The aortic valve was not well visualized. Aortic valve regurgitation is not visualized. No aortic stenosis is present.  6. The inferior vena cava is normal in size with greater than 50% respiratory variability, suggesting right atrial pressure of 3 mmHg. FINDINGS  Left Ventricle: Left ventricular ejection fraction, by  estimation, is >55%. The left ventricle has normal function. Left ventricular endocardial border not optimally defined to evaluate regional wall motion. Definity contrast agent was given IV to delineate the left ventricular endocardial borders. The left ventricular internal cavity size was normal in size. There is mild left ventricular hypertrophy. Left ventricular diastolic parameters are consistent with Grade I diastolic dysfunction (impaired relaxation). Right Ventricle: The right ventricular size is normal. Mildly increased right ventricular wall thickness. Right ventricular systolic function is normal. Left Atrium: Left atrial size was normal in size. Right Atrium: Right atrial size was not well visualized. Pericardium: Trivial pericardial effusion is present. Mitral Valve: The mitral valve was not well visualized. Mild mitral annular calcification. No evidence of mitral valve regurgitation. No evidence of mitral valve stenosis. MV peak gradient, 2.4 mmHg. The mean mitral valve gradient is 1.0 mmHg. Tricuspid Valve: The tricuspid valve is not well visualized. Tricuspid valve regurgitation is mild to moderate. Aortic Valve: The aortic valve was not well visualized. Aortic valve regurgitation is not visualized. No aortic stenosis is present. Aortic valve mean gradient measures 3.0 mmHg. Aortic valve peak gradient measures 6.2 mmHg. Aortic valve area, by VTI measures 2.10 cm. Pulmonic Valve: The pulmonic valve was not well visualized. Pulmonic valve regurgitation is not visualized. No evidence of pulmonic stenosis. Aorta: The aortic root is normal in size and structure. Pulmonary Artery: The pulmonary artery is not well seen. Venous: The inferior vena cava is normal in size with greater than 50% respiratory variability, suggesting right atrial pressure of 3 mmHg. IAS/Shunts: The interatrial septum was not well visualized.  LEFT VENTRICLE PLAX 2D LVIDd:         3.15 cm  Diastology LVIDs:         2.20 cm  LV e'  medial:    5.33 cm/s LV PW:         1.10 cm  LV E/e' medial:  8.7 LV IVS:        0.95 cm  LV e' lateral:   6.53 cm/s LVOT diam:     1.90 cm  LV E/e' lateral: 7.1 LV SV:  39 LV SV Index:   24 LVOT Area:     2.84 cm  RIGHT VENTRICLE RV Basal diam:  3.00 cm LEFT ATRIUM           Index LA diam:      2.40 cm 1.44 cm/m LA Vol (A4C): 11.0 ml 6.60 ml/m  AORTIC VALVE                   PULMONIC VALVE AV Area (Vmax):    2.10 cm    PV Vmax:       0.94 m/s AV Area (Vmean):   2.03 cm    PV Vmean:      64.600 cm/s AV Area (VTI):     2.10 cm    PV VTI:        0.145 m AV Vmax:           124.00 cm/s PV Peak grad:  3.5 mmHg AV Vmean:          81.700 cm/s PV Mean grad:  2.0 mmHg AV VTI:            0.188 m AV Peak Grad:      6.2 mmHg AV Mean Grad:      3.0 mmHg LVOT Vmax:         92.00 cm/s LVOT Vmean:        58.600 cm/s LVOT VTI:          0.139 m LVOT/AV VTI ratio: 0.74  AORTA Ao Root diam: 2.70 cm MITRAL VALVE MV Area (PHT): 6.07 cm    SHUNTS MV Area VTI:   3.66 cm    Systemic VTI:  0.14 m MV Peak grad:  2.4 mmHg    Systemic Diam: 1.90 cm MV Mean grad:  1.0 mmHg MV Vmax:       0.78 m/s MV Vmean:      45.4 cm/s MV Decel Time: 125 msec MV E velocity: 46.30 cm/s MV A velocity: 84.80 cm/s MV E/A ratio:  0.55 Harrell Gave End MD Electronically signed by Nelva Bush MD Signature Date/Time: 03/15/2021/1:02:31 PM    Final     ASSESSMENT: Stage IIIb lung cancer.  PLAN:    Stage IIIb lung cancer: CT scan results from February 27, 2021 reviewed independently and reported as above with bulky 7 cm right middle lobe mass invading right hilum with bulky mediastinal lymphadenopathy highly suspicious for underlying bronchogenic malignancy.  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  MRI of the brain on February 28, 2021 reviewed independently with no obvious metastatic disease.  PET scan on March 14, 2021 confirmed staging with no obvious evidence of metastatic disease.  Patient has now had port  placement.  Proceed with cycle 3 of weekly carboplatin and Taxol today.  Continue with daily XRT. Plan to do 6-8 cycles and then transition to maintenance durvalumab for 1 year.  Return to clinic in 1 week for further evaluation and consideration of cycle 4.    Atrial fibrillation: Continue Cardizem and Eliquis.  Patient also will have a Holter monitor placed for the next several weeks.  Appreciate cardiology input. Anxiety: Continue Xanax as prescribed. Weakness and fatigue: Patient has home health.  Appreciate palliative care input. Left arm swelling: Unlikely DVT since it is opposite side of patient's port and she is already on Eliquis.  Will get an ultrasound to confirm. Poor appetite: Patient states it is likely secondary to changing taste.  Appreciate dietary input.  Patient expressed understanding and  was in agreement with this plan. She also understands that She can call clinic at any time with any questions, concerns, or complaints.   Cancer Staging Primary cancer of right middle lobe of lung [facility]) Staging form: Lung, AJCC 8th Edition - Clinical stage from 03/07/2021: Stage IIIB (cT4, cN2, cM0) - Signed by [name], MD on 03/07/2021 Stage prefix: Initial diagnosis  [name], MD   03/29/2021 9:55 AM

## 2021-03-30 ENCOUNTER — Ambulatory Visit
Admission: RE | Admit: 2021-03-30 | Discharge: 2021-03-30 | Disposition: A | Discharge status: Home / Self Care | Payer: Medicare Other | Source: Ambulatory Visit | Attending: Radiation Oncology | Admitting: Radiation Oncology

## 2021-03-30 ENCOUNTER — Encounter: Payer: Self-pay | Admitting: *Deleted

## 2021-03-30 DIAGNOSIS — R918 Other nonspecific abnormal finding of lung field: Secondary | ICD-10-CM | POA: Diagnosis not present

## 2021-03-30 NOTE — Progress Notes (Signed)
[place] Work  Clinical Social Work was referred by lung navigator for assessment of psychosocial needs- specifically contact patients friend/HCPOA regarding safety concerns at home and community resources.  Clinical Social Worker contacted caregiver by phone  to offer support and assess for needs.    Patient's friend/neighbor/HCPOA, Ginny Ward, shared patient only has one cousin that lives out of state.  [name] is a retired Marine scientist and shared her knowledge regarding community resources in [place].  Patient is currently receiving [employer] provided by insurance- but has minimal appetite and possible taste aversions due to treatment.  Patient is currently utilizing [facility] and PT services.  She indicated Ms. Arnold does not feel she needs additional resources at this time.  CSW encouraged them to contact [place] services to explore other resources that may be helpful at this time or in the future if her needs change.  Ms. Leonides Schanz is familiar with [facility] and has reached out to them. She also has connections to private home aides, if necessary.    Gwinda Maine, LCSW  Clinical Social Worker [facility]

## 2021-03-30 NOTE — Progress Notes (Signed)
[facility]  [phone] [phone]  ID: Brittany Wheeler OB: 10/26/1945  MR#: [id number]  GEX#:[id number]  Patient Care Team: [name], MD as PCP - General (Family Medicine) [name], MD as PCP - Cardiology (Cardiology) [name], RN as Oncology Nurse Navigator  CHIEF COMPLAINT:Stage IIIb lung cancer.  INTERVAL HISTORY: Patient returns to clinic today for further evaluation and consideration of cycle 4 of weekly carboplatinum and Taxol.  She continues to have increased weakness and fatigue as well as a poor appetite.  Her left arm swelling has mildly improved. She does not complain of pain.  She has no neurologic complaints.  She denies any recent fevers.  She has no chest pain, shortness of breath, cough, or hemoptysis.  She has no nausea, vomiting, constipation, or diarrhea.  She has no urinary complaints.  Patient offers no further specific complaints today.  REVIEW OF SYSTEMS:   Review of Systems  Constitutional:  Positive for malaise/fatigue. Negative for fever and weight loss.  Respiratory: Negative.  Negative for cough, hemoptysis and shortness of breath.   Cardiovascular: Negative.  Negative for chest pain and leg swelling.  Gastrointestinal: Negative.  Negative for abdominal pain and nausea.  Genitourinary: Negative.  Negative for dysuria.  Musculoskeletal: Negative.  Negative for back pain.  Skin: Negative.  Negative for itching and rash.  Neurological:  Positive for weakness. Negative for dizziness, focal weakness and headaches.  Psychiatric/Behavioral: Negative.  The patient is not nervous/anxious.    As per HPI. Otherwise, a complete review of systems is negative.  PAST MEDICAL HISTORY: Past Medical History:  Diagnosis Date   Anxiety    Arthritis    Asthma    Cancer ([place])    benign mass removed   Complication of anesthesia    Dyspnea    Dysrhythmia    Hyperlipidemia    Hypertension    Hypothyroidism     PONV (postoperative nausea and vomiting)    Pre-diabetes    Thyroid disease     PAST SURGICAL HISTORY: Past Surgical History:  Procedure Laterality Date   COLONOSCOPY     FLEXIBLE BRONCHOSCOPY Bilateral 03/06/2021   Procedure: FLEXIBLE BRONCHOSCOPY;  Surgeon: [name], MD;  Location: [facility];  Service: Pulmonary;  Laterality: Bilateral;   PORTA CATH INSERTION N/A 03/13/2021   Procedure: PORTA CATH INSERTION;  Surgeon: [name], MD;  Location: [facility];  Service: Cardiovascular;  Laterality: N/A;   stent placed in kidney     THYROIDECTOMY      FAMILY HISTORY: Family History  Problem Relation Age of Onset   Mesothelioma Father    Rectal cancer Sister     ADVANCED DIRECTIVES (Y/N):  N  HEALTH MAINTENANCE: Social History   Tobacco Use   Smoking status: Former    Types: Cigarettes    Quit date: 2012    Years since quitting: 10.6   Smokeless tobacco: Never  Vaping Use   Vaping Use: Never used  Substance Use Topics   Alcohol use: Not Currently   Drug use: Never     Colonoscopy:  PAP:  Bone density:  Lipid panel:  Allergies  Allergen Reactions   Atorvastatin Other (See Comments)    Joint pain   Latex    Lovastatin Other (See Comments)    Arthritis pain in hands   Penicillins     Current Outpatient Medications  Medication Sig Dispense Refill   ALPRAZolam (XANAX) 0.25 MG tablet Take 1 tablet (0.25 mg total) by  mouth 2 (two) times daily as needed for anxiety. 60 tablet 0   apixaban (ELIQUIS) 5 MG TABS tablet Take 1 tablet (5 mg total) by mouth 2 (two) times daily. 60 tablet 5   Budeson-Glycopyrrol-Formoterol (BREZTRI AEROSPHERE) 160-9-4.8 MCG/ACT AERO Inhale 1 spray into the lungs 2 (two) times daily. 10.7 g 2   dexamethasone (DECADRON) 4 MG tablet Take 1 tablet (4 mg total) by mouth daily. 30 tablet 0   diltiazem (CARDIZEM CD) 240 MG 24 hr capsule Take 1 capsule (240 mg total) by mouth daily. To control your heart rate from Afib. 30 capsule  2   fluticasone (FLONASE) 50 MCG/ACT nasal spray Place 1 spray into both nostrils in the morning and at bedtime. 15 mL 1   guaiFENesin-dextromethorphan (ROBITUSSIN DM) 100-10 MG/5ML syrup Take 10 mLs by mouth every 6 (six) hours as needed for cough. 118 mL 0   hydrochlorothiazide (HYDRODIURIL) 12.5 MG tablet Take 12.5 mg by mouth daily.     ipratropium (ATROVENT) 0.06 % nasal spray Place 2 sprays into both nostrils 3 (three) times daily.     ipratropium-albuterol (DUONEB) 0.5-2.5 (3) MG/3ML SOLN Take 3 mLs by nebulization every 6 (six) hours as needed. 120 mL 2   levocetirizine (XYZAL) 5 MG tablet Take 5 mg by mouth daily as needed.     levothyroxine (SYNTHROID) 112 MCG tablet Take 112 mcg by mouth daily.     lidocaine-prilocaine (EMLA) cream Apply 1 application topically as needed. 30 g 0   Multiple Vitamin (MULTI-VITAMIN) tablet Take 1 tablet by mouth daily.     ondansetron (ZOFRAN) 8 MG tablet Take 1 tablet (8 mg total) by mouth 2 (two) times daily as needed for refractory nausea / vomiting. 60 tablet 1   PARoxetine (PAXIL) 20 MG tablet Take 30 mg by mouth daily.     potassium chloride SA (KLOR-CON) 20 MEQ tablet Take 1 tablet (20 mEq total) by mouth daily. 30 tablet 0   prochlorperazine (COMPAZINE) 10 MG tablet TAKE 1 TABLET BY MOUTH EVERY 6 HOURS AS NEEDED FOR NAUSEA OR VOMITING 60 tablet 1   ezetimibe (ZETIA) 10 MG tablet TAKE 1 TABLET(10 MG) BY MOUTH EVERY DAY (Patient not taking: No sig reported)     fluconazole (DIFLUCAN) 150 MG tablet Take 1 tablet (150 mg total) by mouth daily. (Patient not taking: Reported on 04/05/2021) 7 tablet 0   No current facility-administered medications for this visit.   Facility-Administered Medications Ordered in Other Visits  Medication Dose Route Frequency Provider Last Rate Last Admin   heparin lock flush 100 unit/mL  500 Units Intracatheter Once PRN [name], MD       sodium chloride flush (NS) 0.9 % injection 10 mL  10 mL Intravenous PRN  [name], MD   10 mL at 04/05/21 0811    OBJECTIVE: Vitals:   04/05/21 0905  BP: 101/68  Pulse: (!) 48  Resp: 18  Temp: (!) 96.7 F (35.9 C)     Body mass index is 27.55 kg/m.    ECOG FS:2 - Symptomatic, <50% confined to bed  General: Well-developed, well-nourished, no acute distress.  Sitting in a wheelchair. Eyes: Pink conjunctiva, anicteric sclera. HEENT: Normocephalic, moist mucous membranes. Lungs: No audible wheezing or coughing. Heart: Regular rate and rhythm. Abdomen: Soft, nontender, no obvious distention. Musculoskeletal: No edema, cyanosis, or clubbing. Neuro: Alert, answering all questions appropriately. Cranial nerves grossly intact. Skin: No rashes or petechiae noted. Psych: Normal affect.  LAB RESULTS:  Lab Results  Component Value Date   NA 129 (L) 04/05/2021   K 3.2 (L) 04/05/2021   CL 94 (L) 04/05/2021   CO2 25 04/05/2021   GLUCOSE 192 (H) 04/05/2021   BUN 24 (H) 04/05/2021   CREATININE 1.04 (H) 04/05/2021   CALCIUM 7.2 (L) 04/05/2021   PROT 6.1 (L) 04/05/2021   ALBUMIN 2.5 (L) 04/05/2021   AST 18 04/05/2021   ALT 23 04/05/2021   ALKPHOS 59 04/05/2021   BILITOT 0.3 04/05/2021   GFRNONAA 57 (L) 04/05/2021    Lab Results  Component Value Date   WBC 9.3 04/05/2021   NEUTROABS 8.3 (H) 04/05/2021   HGB 10.6 (L) 04/05/2021   HCT 31.8 (L) 04/05/2021   MCV 89.1 04/05/2021   PLT 206 04/05/2021     STUDIES: PERIPHERAL VASCULAR CATHETERIZATION  Result Date: 03/13/2021 See surgical note for result.  NM PET Image Initial (PI) Skull Base To Thigh  Result Date: 03/14/2021 CLINICAL DATA:  Initial treatment strategy for right lung mass and adenopathy. EXAM: NUCLEAR MEDICINE PET SKULL BASE TO THIGH TECHNIQUE: 8.1 mCi F-18 FDG was injected intravenously. Full-ring PET imaging was performed from the skull base to thigh after the radiotracer. CT data was obtained and used for attenuation correction and anatomic localization. Fasting blood glucose:  105 mg/dl COMPARISON:  Chest CT 02/27/2021 FINDINGS: Mediastinal blood pool activity: SUV max 2.51 Liver activity: SUV max NA NECK: No hypermetabolic lymph nodes in the neck. Incidental CT findings: none CHEST: Large right middle lobe lung mass is hypermetabolic with SUV max of 73.71. This is invading the right hilum and mediastinum. Extensive right paratracheal adenopathy with SUV max of 24.41. There is also contralateral AP window adenopathy. Subcarinal adenopathy has an SUV max of 17.69. Small right pleural effusion without hypermetabolic pleural nodules. Few patchy E ill-defined sub solid nodular opacities appear relatively stable. No definite hypermetabolism but recommend CT surveillance. There are several small bilateral supraclavicular nodes which are weakly hypermetabolic. SUV max ranging between 2.5 and 2.6. Incidental CT findings: Vascular calcifications. ABDOMEN/PELVIS: No abnormal hypermetabolic activity within the liver, pancreas, adrenal glands, or spleen. No hypermetabolic lymph nodes in the abdomen or pelvis. Incidental CT findings: Left pelvic kidney is noted with moderate hydronephrosis. SKELETON: No findings suspicious for osseous metastatic disease. Incidental CT findings: none IMPRESSION: 1. Large right middle lobe lung mass is hypermetabolic and consistent with neoplasm. 2. Extensive invasion/involvement of the right hilum and mediastinum. 3. Patchy ground-glass nodules in both lungs will require CT surveillance. 4. Small bilateral supraclavicular nodes are weakly hypermetabolic and suspicious for metastatic adenopathy. 5. No findings to suggest abdominal/pelvic metastatic disease or osseous metastatic disease. Electronically Signed   By: Marijo Sanes M.D.   On: 03/14/2021 15:48   US Venous Img Upper Uni Left  Result Date: 03/29/2021 CLINICAL DATA:  left arm swelling/receiving treatment for lung cancer EXAM: LEFT UPPER EXTREMITY VENOUS DOPPLER ULTRASOUND TECHNIQUE: Gray-scale sonography  with graded compression, as well as color Doppler and duplex ultrasound were performed to evaluate the upper extremity deep venous system from the level of the subclavian vein and including the jugular, axillary, basilic, radial, ulnar and upper cephalic vein. Spectral Doppler was utilized to evaluate flow at rest and with distal augmentation maneuvers. COMPARISON:  None. FINDINGS: Contralateral Subclavian Vein: Respiratory phasicity is normal and symmetric with the symptomatic side. No evidence of thrombus. Normal compressibility. Internal Jugular Vein: No evidence of thrombus. Normal compressibility, respiratory phasicity and response to augmentation. Subclavian Vein: No evidence of thrombus. Normal compressibility, respiratory phasicity and  response to augmentation. Axillary Vein: No evidence of thrombus. Normal compressibility, respiratory phasicity and response to augmentation. Cephalic Vein: No evidence of thrombus. Normal compressibility, respiratory phasicity and response to augmentation. Basilic Vein: No evidence of thrombus. Normal compressibility, respiratory phasicity and response to augmentation. Brachial Veins: No evidence of thrombus. Normal compressibility, respiratory phasicity and response to augmentation. Radial Veins: No evidence of thrombus. Normal compressibility, respiratory phasicity and response to augmentation. Ulnar Veins: No evidence of thrombus. Normal compressibility, respiratory phasicity and response to augmentation. Other Findings:  None visualized. IMPRESSION: No evidence of DVT within the left upper extremity. Electronically Signed   By: Albin Felling M.D.   On: 03/29/2021 14:17   ECHOCARDIOGRAM COMPLETE  Result Date: 03/15/2021    ECHOCARDIOGRAM REPORT   Patient Name:   Brittany Wheeler Date of Exam: 03/14/2021 Medical Rec #:  [id number]        Height:       61.0 in Accession #:    [id number]       Weight:       149.0 lb Date of Birth:  [birth year]        BSA:          1.667 m  Patient Age:    56 years         BP:           130/64 mmHg Patient Gender: F                HR:           95 bpm. Exam Location:  [facility] Procedure: 2D Echo, Color Doppler, Cardiac Doppler and Intracardiac            Opacification Agent Indications:    I48.91 Atrial fibrillation  History:        Patient has no prior history of Echocardiogram examinations.                 Risk Factors:Hypertension, Dyslipidemia and Former Smoker.  Sonographer:    Charmayne Sheer Referring Phys: [id number] BRIAN AGBOR-ETANG  Sonographer Comments: Technically difficult study due to poor echo windows. Image acquisition challenging due to patient body habitus. IMPRESSIONS  1. Left ventricular ejection fraction, by estimation, is >55%. The left ventricle has normal function. Left ventricular endocardial border not optimally defined to evaluate regional wall motion. There is mild left ventricular hypertrophy. Left ventricular diastolic parameters are consistent with Grade I diastolic dysfunction (impaired relaxation).  2. Right ventricular systolic function is normal. The right ventricular size is normal. Mildly increased right ventricular wall thickness.  3. The mitral valve was not well visualized. No evidence of mitral valve regurgitation. No evidence of mitral stenosis.  4. Tricuspid valve regurgitation is mild to moderate.  5. The aortic valve was not well visualized. Aortic valve regurgitation is not visualized. No aortic stenosis is present.  6. The inferior vena cava is normal in size with greater than 50% respiratory variability, suggesting right atrial pressure of 3 mmHg. FINDINGS  Left Ventricle: Left ventricular ejection fraction, by estimation, is >55%. The left ventricle has normal function. Left ventricular endocardial border not optimally defined to evaluate regional wall motion. Definity contrast agent was given IV to delineate the left ventricular endocardial borders. The left ventricular internal cavity size was normal in  size. There is mild left ventricular hypertrophy. Left ventricular diastolic parameters are consistent with Grade I diastolic dysfunction (impaired relaxation). Right Ventricle: The right ventricular size is normal. Mildly increased right ventricular wall thickness. Right ventricular  systolic function is normal. Left Atrium: Left atrial size was normal in size. Right Atrium: Right atrial size was not well visualized. Pericardium: Trivial pericardial effusion is present. Mitral Valve: The mitral valve was not well visualized. Mild mitral annular calcification. No evidence of mitral valve regurgitation. No evidence of mitral valve stenosis. MV peak gradient, 2.4 mmHg. The mean mitral valve gradient is 1.0 mmHg. Tricuspid Valve: The tricuspid valve is not well visualized. Tricuspid valve regurgitation is mild to moderate. Aortic Valve: The aortic valve was not well visualized. Aortic valve regurgitation is not visualized. No aortic stenosis is present. Aortic valve mean gradient measures 3.0 mmHg. Aortic valve peak gradient measures 6.2 mmHg. Aortic valve area, by VTI measures 2.10 cm. Pulmonic Valve: The pulmonic valve was not well visualized. Pulmonic valve regurgitation is not visualized. No evidence of pulmonic stenosis. Aorta: The aortic root is normal in size and structure. Pulmonary Artery: The pulmonary artery is not well seen. Venous: The inferior vena cava is normal in size with greater than 50% respiratory variability, suggesting right atrial pressure of 3 mmHg. IAS/Shunts: The interatrial septum was not well visualized.  LEFT VENTRICLE PLAX 2D LVIDd:         3.15 cm  Diastology LVIDs:         2.20 cm  LV e' medial:    5.33 cm/s LV PW:         1.10 cm  LV E/e' medial:  8.7 LV IVS:        0.95 cm  LV e' lateral:   6.53 cm/s LVOT diam:     1.90 cm  LV E/e' lateral: 7.1 LV SV:         39 LV SV Index:   24 LVOT Area:     2.84 cm  RIGHT VENTRICLE RV Basal diam:  3.00 cm LEFT ATRIUM           Index LA diam:       2.40 cm 1.44 cm/m LA Vol (A4C): 11.0 ml 6.60 ml/m  AORTIC VALVE                   PULMONIC VALVE AV Area (Vmax):    2.10 cm    PV Vmax:       0.94 m/s AV Area (Vmean):   2.03 cm    PV Vmean:      64.600 cm/s AV Area (VTI):     2.10 cm    PV VTI:        0.145 m AV Vmax:           124.00 cm/s PV Peak grad:  3.5 mmHg AV Vmean:          81.700 cm/s PV Mean grad:  2.0 mmHg AV VTI:            0.188 m AV Peak Grad:      6.2 mmHg AV Mean Grad:      3.0 mmHg LVOT Vmax:         92.00 cm/s LVOT Vmean:        58.600 cm/s LVOT VTI:          0.139 m LVOT/AV VTI ratio: 0.74  AORTA Ao Root diam: 2.70 cm MITRAL VALVE MV Area (PHT): 6.07 cm    SHUNTS MV Area VTI:   3.66 cm    Systemic VTI:  0.14 m MV Peak grad:  2.4 mmHg    Systemic Diam: 1.90 cm MV Mean grad:  1.0 mmHg MV  Vmax:       0.78 m/s MV Vmean:      45.4 cm/s MV Decel Time: 125 msec MV E velocity: 46.30 cm/s MV A velocity: 84.80 cm/s MV E/A ratio:  0.55 Nelva Bush MD Electronically signed by Nelva Bush MD Signature Date/Time: 03/15/2021/1:02:31 PM    Final    MM Outside Films Mammo  Result Date: 04/04/2021 This examination belongs to an outside facility and is stored here for comparison purposes only.  Contact the originating outside institution for any associated report or interpretation.   ASSESSMENT: Stage IIIb lung cancer.  PLAN:    Stage IIIb lung cancer: CT scan results from February 27, 2021 reviewed independently and reported as above with bulky 7 cm right middle lobe mass invading right hilum with bulky mediastinal lymphadenopathy highly suspicious for underlying bronchogenic malignancy.  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  MRI of the brain on February 28, 2021 reviewed independently with no obvious metastatic disease.  PET scan on March 14, 2021 confirmed staging with no obvious evidence of metastatic disease.  Patient has now had port placement.  Proceed with cycle 4 of weekly carboplatin and Taxol  today.  Continue with daily XRT.  Plan to do 6-8 cycles and then transition to maintenance durvalumab for 1 year.  Return to clinic in 1 week for further evaluation and consideration of cycle 5.   Atrial fibrillation: Continue Cardizem and Eliquis. Appreciate cardiology input. Anxiety: Continue Xanax as prescribed. Weakness and fatigue: Patient has home health.  Appreciate palliative care input. Left arm swelling: Ultrasound negative for DVT.  Monitor. Poor appetite: Appreciate dietary input.  Patient was given a prescription for 24m dexamethasone today. Hypokalemia: Patient was given a prescription for oral potassium supplementation.  Patient expressed understanding and was in agreement with this plan. She also understands that She can call clinic at any time with any questions, concerns, or complaints.   Cancer Staging Primary cancer of right middle lobe of lung ([facility] Staging form: Lung, AJCC 8th Edition - Clinical stage from 03/07/2021: Stage IIIB (cT4, cN2, cM0) - Signed by FLloyd Huger MD on 03/07/2021 Stage prefix: Initial diagnosis  TLloyd Huger MD   04/05/2021 3:57 PM

## 2021-03-31 ENCOUNTER — Ambulatory Visit
Admission: RE | Admit: 2021-03-31 | Discharge: 2021-03-31 | Disposition: A | Discharge status: Home / Self Care | Payer: Medicare Other | Source: Ambulatory Visit | Attending: Oncology | Admitting: Oncology

## 2021-03-31 ENCOUNTER — Ambulatory Visit
Admission: RE | Admit: 2021-03-31 | Discharge: 2021-03-31 | Disposition: A | Discharge status: Home / Self Care | Payer: Medicare Other | Source: Ambulatory Visit | Attending: Radiation Oncology | Admitting: Radiation Oncology

## 2021-03-31 ENCOUNTER — Other Ambulatory Visit: Payer: Self-pay

## 2021-03-31 DIAGNOSIS — Z1231 Encounter for screening mammogram for malignant neoplasm of breast: Secondary | ICD-10-CM | POA: Insufficient documentation

## 2021-03-31 DIAGNOSIS — R918 Other nonspecific abnormal finding of lung field: Secondary | ICD-10-CM | POA: Diagnosis not present

## 2021-03-31 DIAGNOSIS — C342 Malignant neoplasm of middle lobe, bronchus or lung: Secondary | ICD-10-CM | POA: Insufficient documentation

## 2021-04-03 ENCOUNTER — Ambulatory Visit
Admission: RE | Admit: 2021-04-03 | Discharge: 2021-04-03 | Disposition: A | Discharge status: Home / Self Care | Payer: Medicare Other | Source: Ambulatory Visit | Attending: Radiation Oncology | Admitting: Radiation Oncology

## 2021-04-03 DIAGNOSIS — R918 Other nonspecific abnormal finding of lung field: Secondary | ICD-10-CM | POA: Diagnosis not present

## 2021-04-04 ENCOUNTER — Inpatient Hospital Stay
Admission: RE | Admit: 2021-04-04 | Discharge: 2021-04-04 | Disposition: A | Discharge status: Home / Self Care | Payer: Self-pay | Source: Ambulatory Visit | Attending: *Deleted | Admitting: *Deleted

## 2021-04-04 ENCOUNTER — Other Ambulatory Visit: Payer: Self-pay | Admitting: *Deleted

## 2021-04-04 ENCOUNTER — Other Ambulatory Visit: Payer: Self-pay

## 2021-04-04 ENCOUNTER — Ambulatory Visit
Admission: RE | Admit: 2021-04-04 | Discharge: 2021-04-04 | Disposition: A | Discharge status: Home / Self Care | Payer: Medicare Other | Source: Ambulatory Visit | Attending: Radiation Oncology | Admitting: Radiation Oncology

## 2021-04-04 DIAGNOSIS — Z1231 Encounter for screening mammogram for malignant neoplasm of breast: Secondary | ICD-10-CM

## 2021-04-04 DIAGNOSIS — R918 Other nonspecific abnormal finding of lung field: Secondary | ICD-10-CM | POA: Diagnosis not present

## 2021-04-05 ENCOUNTER — Inpatient Hospital Stay: Payer: Medicare Other

## 2021-04-05 ENCOUNTER — Inpatient Hospital Stay (HOSPITAL_BASED_OUTPATIENT_CLINIC_OR_DEPARTMENT_OTHER): Payer: Medicare Other | Admitting: Oncology

## 2021-04-05 ENCOUNTER — Other Ambulatory Visit: Payer: Self-pay | Admitting: Oncology

## 2021-04-05 ENCOUNTER — Ambulatory Visit
Admission: RE | Admit: 2021-04-05 | Discharge: 2021-04-05 | Disposition: A | Discharge status: Home / Self Care | Payer: Medicare Other | Source: Ambulatory Visit | Attending: Radiation Oncology | Admitting: Radiation Oncology

## 2021-04-05 ENCOUNTER — Encounter: Payer: Self-pay | Admitting: Oncology

## 2021-04-05 ENCOUNTER — Other Ambulatory Visit: Payer: Self-pay

## 2021-04-05 ENCOUNTER — Inpatient Hospital Stay (HOSPITAL_BASED_OUTPATIENT_CLINIC_OR_DEPARTMENT_OTHER): Payer: Medicare Other | Admitting: Hospice and Palliative Medicine

## 2021-04-05 VITALS — BP 101/68 | HR 48 | Temp 96.7°F | Resp 18 | Wt 145.8 lb

## 2021-04-05 DIAGNOSIS — C342 Malignant neoplasm of middle lobe, bronchus or lung: Secondary | ICD-10-CM

## 2021-04-05 DIAGNOSIS — R918 Other nonspecific abnormal finding of lung field: Secondary | ICD-10-CM | POA: Diagnosis not present

## 2021-04-05 DIAGNOSIS — R928 Other abnormal and inconclusive findings on diagnostic imaging of breast: Secondary | ICD-10-CM

## 2021-04-05 DIAGNOSIS — N6489 Other specified disorders of breast: Secondary | ICD-10-CM

## 2021-04-05 LAB — CBC WITH DIFFERENTIAL/PLATELET
Abs Immature Granulocytes: 0.18 10*3/uL — ABNORMAL HIGH (ref 0.00–0.07)
Basophils Absolute: 0 10*3/uL (ref 0.0–0.1)
Basophils Relative: 0 %
Eosinophils Absolute: 0 10*3/uL (ref 0.0–0.5)
Eosinophils Relative: 0 %
HCT: 31.8 % — ABNORMAL LOW (ref 36.0–46.0)
Hemoglobin: 10.6 g/dL — ABNORMAL LOW (ref 12.0–15.0)
Immature Granulocytes: 2 %
Lymphocytes Relative: 2 %
Lymphs Abs: 0.2 10*3/uL — ABNORMAL LOW (ref 0.7–4.0)
MCH: 29.7 pg (ref 26.0–34.0)
MCHC: 33.3 g/dL (ref 30.0–36.0)
MCV: 89.1 fL (ref 80.0–100.0)
Monocytes Absolute: 0.7 10*3/uL (ref 0.1–1.0)
Monocytes Relative: 7 %
Neutro Abs: 8.3 10*3/uL — ABNORMAL HIGH (ref 1.7–7.7)
Neutrophils Relative %: 89 %
Platelets: 206 10*3/uL (ref 150–400)
RBC: 3.57 MIL/uL — ABNORMAL LOW (ref 3.87–5.11)
RDW: 14.2 % (ref 11.5–15.5)
WBC: 9.3 10*3/uL (ref 4.0–10.5)
nRBC: 0 % (ref 0.0–0.2)

## 2021-04-05 LAB — COMPREHENSIVE METABOLIC PANEL
ALT: 23 U/L (ref 0–44)
AST: 18 U/L (ref 15–41)
Albumin: 2.5 g/dL — ABNORMAL LOW (ref 3.5–5.0)
Alkaline Phosphatase: 59 U/L (ref 38–126)
Anion gap: 10 (ref 5–15)
BUN: 24 mg/dL — ABNORMAL HIGH (ref 8–23)
CO2: 25 mmol/L (ref 22–32)
Calcium: 7.2 mg/dL — ABNORMAL LOW (ref 8.9–10.3)
Chloride: 94 mmol/L — ABNORMAL LOW (ref 98–111)
Creatinine, Ser: 1.04 mg/dL — ABNORMAL HIGH (ref 0.44–1.00)
GFR, Estimated: 57 mL/min — ABNORMAL LOW (ref >60)
Glucose, Bld: 192 mg/dL — ABNORMAL HIGH (ref 70–99)
Potassium: 3.2 mmol/L — ABNORMAL LOW (ref 3.5–5.1)
Sodium: 129 mmol/L — ABNORMAL LOW (ref 135–145)
Total Bilirubin: 0.3 mg/dL (ref 0.3–1.2)
Total Protein: 6.1 g/dL — ABNORMAL LOW (ref 6.5–8.1)

## 2021-04-05 MED ORDER — SODIUM CHLORIDE 0.9 % IV SOLN
Freq: Once | INTRAVENOUS | Status: AC
  Filled 2021-04-05: qty 250

## 2021-04-05 MED ORDER — POTASSIUM CHLORIDE CRYS ER 20 MEQ PO TBCR: 20.0000 meq | EXTENDED_RELEASE_TABLET | Freq: Every day | ORAL | 0 refills | Status: AC

## 2021-04-05 MED ORDER — HEPARIN SOD (PORK) LOCK FLUSH 100 UNIT/ML IV SOLN
500.0000 [IU] | Freq: Once | INTRAVENOUS | Status: DC | PRN
  Filled 2021-04-05: qty 5

## 2021-04-05 MED ORDER — PALONOSETRON HCL INJECTION 0.25 MG/5ML
0.2500 mg | Freq: Once | INTRAVENOUS | Status: AC
  Administered 2021-04-05: 0.25 mg via INTRAVENOUS
  Filled 2021-04-05: qty 5

## 2021-04-05 MED ORDER — SODIUM CHLORIDE 0.9 % IV SOLN
10.0000 mg | Freq: Once | INTRAVENOUS | Status: AC
  Administered 2021-04-05: 10 mg via INTRAVENOUS
  Filled 2021-04-05: qty 10

## 2021-04-05 MED ORDER — SODIUM CHLORIDE 0.9% FLUSH
10.0000 mL | INTRAVENOUS | Status: DC | PRN
  Administered 2021-04-05: 10 mL via INTRAVENOUS
  Filled 2021-04-05: qty 10

## 2021-04-05 MED ORDER — DEXAMETHASONE 4 MG PO TABS: 4.0000 mg | ORAL_TABLET | Freq: Every day | ORAL | 0 refills | Status: AC

## 2021-04-05 MED ORDER — HEPARIN SOD (PORK) LOCK FLUSH 100 UNIT/ML IV SOLN
500.0000 [IU] | Freq: Once | INTRAVENOUS | Status: AC
  Administered 2021-04-05: 500 [IU] via INTRAVENOUS
  Filled 2021-04-05: qty 5

## 2021-04-05 MED ORDER — SODIUM CHLORIDE 0.9 % IV SOLN
45.0000 mg/m2 | Freq: Once | INTRAVENOUS | Status: AC
  Administered 2021-04-05: 78 mg via INTRAVENOUS
  Filled 2021-04-05: qty 13

## 2021-04-05 MED ORDER — DIPHENHYDRAMINE HCL 50 MG/ML IJ SOLN
25.0000 mg | Freq: Once | INTRAMUSCULAR | Status: AC
  Administered 2021-04-05: 25 mg via INTRAVENOUS
  Filled 2021-04-05: qty 1

## 2021-04-05 MED ORDER — CARBOPLATIN CHEMO INJECTION 450 MG/45ML
155.0000 mg | Freq: Once | INTRAVENOUS | Status: AC
  Administered 2021-04-05: 160 mg via INTRAVENOUS
  Filled 2021-04-05: qty 16

## 2021-04-05 MED ORDER — FAMOTIDINE 20 MG IN NS 100 ML IVPB
20.0000 mg | Freq: Once | INTRAVENOUS | Status: AC
  Administered 2021-04-05: 20 mg via INTRAVENOUS
  Filled 2021-04-05: qty 20

## 2021-04-05 NOTE — Progress Notes (Signed)
[place]  Telephone:([phone] [phone]   Name: Brittany Wheeler Date: 04/05/2021 MRN: [id number]  DOB: 12-10-1945  Patient Care Team: [name], MD as PCP - General (Family Medicine) [name], MD as PCP - Cardiology (Cardiology) [name], RN as Oncology Nurse Navigator    REASON FOR CONSULTATION: Brittany Wheeler is a 75 y.o. 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 Patient was hospitalized in July 2022 for shortness of breath and was found to have a large bulky central right-sided lung mass.  Patient underwent bronchoscopy on 03/06/2021 with pathology favoring squamous cell carcinoma.  PET scan on 03/13/2021 revealed hypermetabolic large right middle lobe lung mass with extensive invasion of the right hilum and mediastinum.  Patient had weekly hypermetabolic small bilateral supraclavicular nodes but no findings to suggest distal metastatic disease.  Palliative care was consulted to address goals and manage ongoing symptoms.  SOCIAL HISTORY:     reports that she quit smoking about 10 years ago. Her smoking use included cigarettes. She has never used smokeless tobacco. She reports that she does not currently use alcohol. She reports that she does not use drugs.  Patient was never married and has no children.  She lives at home alone with two Deneen Harts and [place].  She has a neighbor, Lawyer, who is her healthcare power of attorney.  Patient has a cousin from out of state.  ADVANCE DIRECTIVES:  On file  CODE STATUS: DNR  PAST MEDICAL HISTORY: Past Medical History:  Diagnosis Date   Anxiety    Arthritis    Asthma    Cancer ([place])    benign mass removed   Complication of anesthesia    Dyspnea    Dysrhythmia     Hyperlipidemia    Hypertension    Hypothyroidism    PONV (postoperative nausea and vomiting)    Pre-diabetes    Thyroid disease     PAST SURGICAL HISTORY:  Past Surgical History:  Procedure Laterality Date   COLONOSCOPY     FLEXIBLE BRONCHOSCOPY Bilateral 03/06/2021   Procedure: FLEXIBLE BRONCHOSCOPY;  Surgeon: [name], MD;  Location: [facility];  Service: Pulmonary;  Laterality: Bilateral;   PORTA CATH INSERTION N/A 03/13/2021   Procedure: PORTA CATH INSERTION;  Surgeon: [name], MD;  Location: [facility];  Service: Cardiovascular;  Laterality: N/A;   stent placed in kidney     THYROIDECTOMY      HEMATOLOGY/ONCOLOGY HISTORY:  Oncology History  Primary cancer of right middle lobe of lung ([place])  03/07/2021 Initial Diagnosis   Primary cancer of right middle lobe of lung ([place])   03/07/2021 Cancer Staging   Staging form: Lung, AJCC 8th Edition - Clinical stage from 03/07/2021: Stage IIIB (cT4, cN2, cM0) - Signed by [name], MD on 03/07/2021 Stage prefix: Initial diagnosis   03/15/2021 -  Chemotherapy    Patient is on Treatment Plan: LUNG CARBOPLATIN / PACLITAXEL + XRT Q7D        ALLERGIES:  is allergic to atorvastatin, latex, lovastatin, and penicillins.  MEDICATIONS:  Current Outpatient Medications  Medication Sig Dispense Refill   ALPRAZolam (XANAX) 0.25 MG tablet Take 1 tablet (0.25 mg total) by mouth 2 (two) times daily as needed for anxiety. 60 tablet 0   apixaban (ELIQUIS) 5 MG TABS tablet Take 1 tablet (5 mg  total) by mouth 2 (two) times daily. 60 tablet 5   Budeson-Glycopyrrol-Formoterol (BREZTRI AEROSPHERE) 160-9-4.8 MCG/ACT AERO Inhale 1 spray into the lungs 2 (two) times daily. 10.7 g 2   dexamethasone (DECADRON) 4 MG tablet Take 1 tablet (4 mg total) by mouth daily. 30 tablet 0   diltiazem (CARDIZEM CD) 240 MG 24 hr capsule Take 1 capsule (240 mg total) by mouth daily. To control your heart rate from Afib. 30 capsule 2   ezetimibe (ZETIA) 10  MG tablet TAKE 1 TABLET(10 MG) BY MOUTH EVERY DAY (Patient not taking: No sig reported)     fluconazole (DIFLUCAN) 150 MG tablet Take 1 tablet (150 mg total) by mouth daily. (Patient not taking: Reported on 04/05/2021) 7 tablet 0   fluticasone (FLONASE) 50 MCG/ACT nasal spray Place 1 spray into both nostrils in the morning and at bedtime. 15 mL 1   guaiFENesin-dextromethorphan (ROBITUSSIN DM) 100-10 MG/5ML syrup Take 10 mLs by mouth every 6 (six) hours as needed for cough. 118 mL 0   hydrochlorothiazide (HYDRODIURIL) 12.5 MG tablet Take 12.5 mg by mouth daily.     ipratropium (ATROVENT) 0.06 % nasal spray Place 2 sprays into both nostrils 3 (three) times daily.     ipratropium-albuterol (DUONEB) 0.5-2.5 (3) MG/3ML SOLN Take 3 mLs by nebulization every 6 (six) hours as needed. 120 mL 2   levocetirizine (XYZAL) 5 MG tablet Take 5 mg by mouth daily as needed.     levothyroxine (SYNTHROID) 112 MCG tablet Take 112 mcg by mouth daily.     lidocaine-prilocaine (EMLA) cream Apply 1 application topically as needed. 30 g 0   Multiple Vitamin (MULTI-VITAMIN) tablet Take 1 tablet by mouth daily.     ondansetron (ZOFRAN) 8 MG tablet Take 1 tablet (8 mg total) by mouth 2 (two) times daily as needed for refractory nausea / vomiting. 60 tablet 1   PARoxetine (PAXIL) 20 MG tablet Take 30 mg by mouth daily.     potassium chloride SA (KLOR-CON) 20 MEQ tablet Take 1 tablet (20 mEq total) by mouth daily. 30 tablet 0   prochlorperazine (COMPAZINE) 10 MG tablet TAKE 1 TABLET BY MOUTH EVERY 6 HOURS AS NEEDED FOR NAUSEA OR VOMITING 60 tablet 1   No current facility-administered medications for this visit.   Facility-Administered Medications Ordered in Other Visits  Medication Dose Route Frequency Provider Last Rate Last Admin   heparin lock flush 100 unit/mL  500 Units Intravenous Once [name], MD       heparin lock flush 100 unit/mL  500 Units Intracatheter Once PRN [name], MD       sodium  chloride flush (NS) 0.9 % injection 10 mL  10 mL Intravenous PRN [name], MD   10 mL at 04/05/21 5427    VITAL SIGNS: There were no vitals taken for this visit. There were no vitals filed for this visit.  Estimated body mass index is 27.55 kg/m as calculated from the following:   Height as of 03/13/21: _0  (1.549 m).   Weight as of an earlier encounter on 04/05/21: 145 lb 12.8 oz (66.1 kg).  LABS: CBC:    Component Value Date/Time   WBC 9.3 04/05/2021 0811   HGB 10.6 (L) 04/05/2021 0811   HCT 31.8 (L) 04/05/2021 0811   PLT 206 04/05/2021 0811   MCV 89.1 04/05/2021 0811   NEUTROABS 8.3 (H) 04/05/2021 0811   LYMPHSABS 0.2 (L) 04/05/2021 0811   MONOABS 0.7 04/05/2021 0623  EOSABS 0.0 04/05/2021 0811   BASOSABS 0.0 04/05/2021 0811   Comprehensive Metabolic Panel:    Component Value Date/Time   NA 129 (L) 04/05/2021 0811   K 3.2 (L) 04/05/2021 0811   CL 94 (L) 04/05/2021 0811   CO2 25 04/05/2021 0811   BUN 24 (H) 04/05/2021 0811   CREATININE 1.04 (H) 04/05/2021 0811   GLUCOSE 192 (H) 04/05/2021 0811   CALCIUM 7.2 (L) 04/05/2021 0811   AST 18 04/05/2021 0811   ALT 23 04/05/2021 0811   ALKPHOS 59 04/05/2021 0811   BILITOT 0.3 04/05/2021 0811   PROT 6.1 (L) 04/05/2021 0811   ALBUMIN 2.5 (L) 04/05/2021 0811    RADIOGRAPHIC STUDIES: PERIPHERAL VASCULAR CATHETERIZATION  Result Date: 03/13/2021 See surgical note for result.  NM PET Image Initial (PI) Skull Base To Thigh  Result Date: 03/14/2021 CLINICAL DATA:  Initial treatment strategy for right lung mass and adenopathy. EXAM: NUCLEAR MEDICINE PET SKULL BASE TO THIGH TECHNIQUE: 8.1 mCi F-18 FDG was injected intravenously. Full-ring PET imaging was performed from the skull base to thigh after the radiotracer. CT data was obtained and used for attenuation correction and anatomic localization. Fasting blood glucose: 105 mg/dl COMPARISON:  Chest CT 02/27/2021 FINDINGS: Mediastinal blood pool activity: SUV max 2.51 Liver  activity: SUV max NA NECK: No hypermetabolic lymph nodes in the neck. Incidental CT findings: none CHEST: Large right middle lobe lung mass is hypermetabolic with SUV max of 83.29. This is invading the right hilum and mediastinum. Extensive right paratracheal adenopathy with SUV max of 24.41. There is also contralateral AP window adenopathy. Subcarinal adenopathy has an SUV max of 17.69. Small right pleural effusion without hypermetabolic pleural nodules. Few patchy E ill-defined sub solid nodular opacities appear relatively stable. No definite hypermetabolism but recommend CT surveillance. There are several small bilateral supraclavicular nodes which are weakly hypermetabolic. SUV max ranging between 2.5 and 2.6. Incidental CT findings: Vascular calcifications. ABDOMEN/PELVIS: No abnormal hypermetabolic activity within the liver, pancreas, adrenal glands, or spleen. No hypermetabolic lymph nodes in the abdomen or pelvis. Incidental CT findings: Left pelvic kidney is noted with moderate hydronephrosis. SKELETON: No findings suspicious for osseous metastatic disease. Incidental CT findings: none IMPRESSION: 1. Large right middle lobe lung mass is hypermetabolic and consistent with neoplasm. 2. Extensive invasion/involvement of the right hilum and mediastinum. 3. Patchy ground-glass nodules in both lungs will require CT surveillance. 4. Small bilateral supraclavicular nodes are weakly hypermetabolic and suspicious for metastatic adenopathy. 5. No findings to suggest abdominal/pelvic metastatic disease or osseous metastatic disease. Electronically Signed   By: Marijo Sanes M.D.   On: 03/14/2021 15:48   US Venous Img Upper Uni Left  Result Date: 03/29/2021 CLINICAL DATA:  left arm swelling/receiving treatment for lung cancer EXAM: LEFT UPPER EXTREMITY VENOUS DOPPLER ULTRASOUND TECHNIQUE: Gray-scale sonography with graded compression, as well as color Doppler and duplex ultrasound were performed to evaluate the  upper extremity deep venous system from the level of the subclavian vein and including the jugular, axillary, basilic, radial, ulnar and upper cephalic vein. Spectral Doppler was utilized to evaluate flow at rest and with distal augmentation maneuvers. COMPARISON:  None. FINDINGS: Contralateral Subclavian Vein: Respiratory phasicity is normal and symmetric with the symptomatic side. No evidence of thrombus. Normal compressibility. Internal Jugular Vein: No evidence of thrombus. Normal compressibility, respiratory phasicity and response to augmentation. Subclavian Vein: No evidence of thrombus. Normal compressibility, respiratory phasicity and response to augmentation. Axillary Vein: No evidence of thrombus. Normal compressibility, respiratory phasicity and response to  augmentation. Cephalic Vein: No evidence of thrombus. Normal compressibility, respiratory phasicity and response to augmentation. Basilic Vein: No evidence of thrombus. Normal compressibility, respiratory phasicity and response to augmentation. Brachial Veins: No evidence of thrombus. Normal compressibility, respiratory phasicity and response to augmentation. Radial Veins: No evidence of thrombus. Normal compressibility, respiratory phasicity and response to augmentation. Ulnar Veins: No evidence of thrombus. Normal compressibility, respiratory phasicity and response to augmentation. Other Findings:  None visualized. IMPRESSION: No evidence of DVT within the left upper extremity. Electronically Signed   By: Albin Felling M.D.   On: 03/29/2021 14:17   ECHOCARDIOGRAM COMPLETE  Result Date: 03/15/2021    ECHOCARDIOGRAM REPORT   Patient Name:   Brittany Wheeler Date of Exam: 03/14/2021 Medical Rec #:  [id number]        Height:       61.0 in Accession #:    [id number]       Weight:       149.0 lb Date of Birth:  1946/02/23        BSA:          1.667 m Patient Age:    64 years         BP:           130/64 mmHg Patient Gender: F                HR:            95 bpm. Exam Location:  [facility] Procedure: 2D Echo, Color Doppler, Cardiac Doppler and Intracardiac            Opacification Agent Indications:    I48.91 Atrial fibrillation  History:        Patient has no prior history of Echocardiogram examinations.                 Risk Factors:Hypertension, Dyslipidemia and Former Smoker.  Sonographer:    Charmayne Sheer Referring Phys: [id number] BRIAN AGBOR-ETANG  Sonographer Comments: Technically difficult study due to poor echo windows. Image acquisition challenging due to patient body habitus. IMPRESSIONS  1. Left ventricular ejection fraction, by estimation, is >55%. The left ventricle has normal function. Left ventricular endocardial border not optimally defined to evaluate regional wall motion. There is mild left ventricular hypertrophy. Left ventricular diastolic parameters are consistent with Grade I diastolic dysfunction (impaired relaxation).  2. Right ventricular systolic function is normal. The right ventricular size is normal. Mildly increased right ventricular wall thickness.  3. The mitral valve was not well visualized. No evidence of mitral valve regurgitation. No evidence of mitral stenosis.  4. Tricuspid valve regurgitation is mild to moderate.  5. The aortic valve was not well visualized. Aortic valve regurgitation is not visualized. No aortic stenosis is present.  6. The inferior vena cava is normal in size with greater than 50% respiratory variability, suggesting right atrial pressure of 3 mmHg. FINDINGS  Left Ventricle: Left ventricular ejection fraction, by estimation, is >55%. The left ventricle has normal function. Left ventricular endocardial border not optimally defined to evaluate regional wall motion. Definity contrast agent was given IV to delineate the left ventricular endocardial Jonaya Freshour. The left ventricular internal cavity size was normal in size. There is mild left ventricular hypertrophy. Left ventricular diastolic parameters are consistent with  Grade I diastolic dysfunction (impaired relaxation). Right Ventricle: The right ventricular size is normal. Mildly increased right ventricular wall thickness. Right ventricular systolic function is normal. Left Atrium: Left atrial size was normal in size. Right Atrium: Right  atrial size was not well visualized. Pericardium: Trivial pericardial effusion is present. Mitral Valve: The mitral valve was not well visualized. Mild mitral annular calcification. No evidence of mitral valve regurgitation. No evidence of mitral valve stenosis. MV peak gradient, 2.4 mmHg. The mean mitral valve gradient is 1.0 mmHg. Tricuspid Valve: The tricuspid valve is not well visualized. Tricuspid valve regurgitation is mild to moderate. Aortic Valve: The aortic valve was not well visualized. Aortic valve regurgitation is not visualized. No aortic stenosis is present. Aortic valve mean gradient measures 3.0 mmHg. Aortic valve peak gradient measures 6.2 mmHg. Aortic valve area, by VTI measures 2.10 cm. Pulmonic Valve: The pulmonic valve was not well visualized. Pulmonic valve regurgitation is not visualized. No evidence of pulmonic stenosis. Aorta: The aortic root is normal in size and structure. Pulmonary Artery: The pulmonary artery is not well seen. Venous: The inferior vena cava is normal in size with greater than 50% respiratory variability, suggesting right atrial pressure of 3 mmHg. IAS/Shunts: The interatrial septum was not well visualized.  LEFT VENTRICLE PLAX 2D LVIDd:         3.15 cm  Diastology LVIDs:         2.20 cm  LV e' medial:    5.33 cm/s LV PW:         1.10 cm  LV E/e' medial:  8.7 LV IVS:        0.95 cm  LV e' lateral:   6.53 cm/s LVOT diam:     1.90 cm  LV E/e' lateral: 7.1 LV SV:         39 LV SV Index:   24 LVOT Area:     2.84 cm  RIGHT VENTRICLE RV Basal diam:  3.00 cm LEFT ATRIUM           Index LA diam:      2.40 cm 1.44 cm/m LA Vol (A4C): 11.0 ml 6.60 ml/m  AORTIC VALVE                   PULMONIC VALVE AV Area  (Vmax):    2.10 cm    PV Vmax:       0.94 m/s AV Area (Vmean):   2.03 cm    PV Vmean:      64.600 cm/s AV Area (VTI):     2.10 cm    PV VTI:        0.145 m AV Vmax:           124.00 cm/s PV Peak grad:  3.5 mmHg AV Vmean:          81.700 cm/s PV Mean grad:  2.0 mmHg AV VTI:            0.188 m AV Peak Grad:      6.2 mmHg AV Mean Grad:      3.0 mmHg LVOT Vmax:         92.00 cm/s LVOT Vmean:        58.600 cm/s LVOT VTI:          0.139 m LVOT/AV VTI ratio: 0.74  AORTA Ao Root diam: 2.70 cm MITRAL VALVE MV Area (PHT): 6.07 cm    SHUNTS MV Area VTI:   3.66 cm    Systemic VTI:  0.14 m MV Peak grad:  2.4 mmHg    Systemic Diam: 1.90 cm MV Mean grad:  1.0 mmHg MV Vmax:       0.78 m/s MV Vmean:  45.4 cm/s MV Decel Time: 125 msec MV E velocity: 46.30 cm/s MV A velocity: 84.80 cm/s MV E/A ratio:  0.55 Nelva Bush MD Electronically signed by Nelva Bush MD Signature Date/Time: 03/15/2021/1:02:31 PM    Final    MM Outside Films Mammo  Result Date: 04/04/2021 This examination belongs to an outside facility and is stored here for comparison purposes only.  Contact the originating outside institution for any associated report or interpretation.   PERFORMANCE STATUS (ECOG) : 2 - Symptomatic, <50% confined to bed  Review of Systems Unless otherwise noted, a complete review of systems is negative.  Physical Exam General: NAD Pulmonary: Unlabored Extremities: no edema, no joint deformities Skin: no rashes Neurological: Weakness but otherwise nonfocal  IMPRESSION: Follow-up visit.  Patient seen in infusion.  Patient was somewhat drowsy after receiving chemo premeds.  However, she says that she has been doing poorly recently.  Says that she has had some cognitive "fog" as well as continued poor oral intake.  She has been sleeping often during the day.  She endorses persistent fatigue.  However, patient says that she actually feels better today.  Patient has reportedly improved when she has received  steroids.  Dr. [name] has restarted her on dexamethasone today.  Agree with same.  Patient continues to receive followed by nutrition.  Patient agreement to home palliative care following.  Patient says that she remains committed to completing this cycle of chemotherapy but does not think that she would want repeat chemotherapy in the future and would likely opt to pursue hospice care.  PLAN: -Continue current scope of treatment -Agree with dexamethasone daily for appetite/fatigue -Continue Paxil 30 mg daily -Referral for community palliative care -RTC 2 weeks  Case and plan discussed with Dr. [name]    Patient expressed understanding and was in agreement with this plan. She also understands that She can call the clinic at any time with any questions, concerns, or complaints.     Time Total: 20 minutes  Visit consisted of counseling and education dealing with the complex and emotionally intense issues of symptom management and palliative care in the setting of serious and potentially life-threatening illness.Greater than 50%  of this time was spent counseling and coordinating care related to the above assessment and plan.  Signed by: Altha Harm, PhD, NP-C

## 2021-04-05 NOTE — Patient Instructions (Signed)
[facility]  Discharge Instructions: Thank you for choosing [facility] to provide your oncology and hematology care.  If you have a lab appointment with the Cancer Center, please go directly to the Cancer Center and check in at the registration area.  Wear comfortable clothing and clothing appropriate for easy access to any Portacath or PICC line.   We strive to give you quality time with your provider. You may need to reschedule your appointment if you arrive late (15 or more minutes).  Arriving late affects you and other patients whose appointments are after yours.  Also, if you miss three or more appointments without notifying the office, you may be dismissed from the clinic at the provider's discretion.      For prescription refill requests, have your pharmacy contact our office and allow 72 hours for refills to be completed.    Today you received the following chemotherapy and/or immunotherapy agents Taxol, carboplatin   To help prevent nausea and vomiting after your treatment, we encourage you to take your nausea medication as directed.  BELOW ARE SYMPTOMS THAT SHOULD BE REPORTED IMMEDIATELY: *FEVER GREATER THAN 100.4 F (38 C) OR HIGHER *CHILLS OR SWEATING *NAUSEA AND VOMITING THAT IS NOT CONTROLLED WITH YOUR NAUSEA MEDICATION *UNUSUAL SHORTNESS OF BREATH *UNUSUAL BRUISING OR BLEEDING *URINARY PROBLEMS (pain or burning when urinating, or frequent urination) *BOWEL PROBLEMS (unusual diarrhea, constipation, pain near the anus) TENDERNESS IN MOUTH AND THROAT WITH OR WITHOUT PRESENCE OF ULCERS (sore throat, sores in mouth, or a toothache) UNUSUAL RASH, SWELLING OR PAIN  UNUSUAL VAGINAL DISCHARGE OR ITCHING   Items with * indicate a potential emergency and should be followed up as soon as possible or go to the Emergency Department if any problems should occur.  Please show the CHEMOTHERAPY ALERT CARD or IMMUNOTHERAPY ALERT CARD at  check-in to the Emergency Department and triage nurse.  Should you have questions after your visit or need to cancel or reschedule your appointment, please contact [facility]  [phone] and follow the prompts.  Office hours are 8:00 a.m. to 4:30 p.m. Monday - Friday. Please note that voicemails left after 4:00 p.m. may not be returned until the following business day.  We are closed weekends and major holidays. You have access to a nurse at all times for urgent questions. Please call the main number to the clinic [phone] and follow the prompts.  For any non-urgent questions, you may also contact your provider using MyChart. We now offer e-Visits for anyone 18 and older to request care online for non-urgent symptoms. For details visit [email or web address].   Also download the MyChart app! Go to the app store, search "MyChart", open the app, select [facility], and log in with your MyChart username and password.  Due to Covid, a mask is required upon entering the hospital/clinic. If you do not have a mask, one will be given to you upon arrival. For doctor visits, patients may have 1 support person aged 18 or older with them. For treatment visits, patients cannot have anyone with them due to current Covid guidelines and our immunocompromised population.  

## 2021-04-05 NOTE — Progress Notes (Signed)
Nutrition Follow-up:   Patient with lung cancer.  Receiving concurrent chemotherapy and radiation.    Spoke with caregiver/neighbor Ginny Ward via phone.  Ginny did receive handouts emailed last week and printed them for patient to read and review.  Patient ate well for few days after treatment due to steroids then appetite declined after that.  Patient has been drinking boost plus shakes and likes them.      Medications: MD adding steriods daily  Labs: reviewed  Anthropometrics:   Weight 145 lb today  148 lb 12.8 oz on 8/24 (fluid) 140 lb 8 oz on 8/17 156 lb on 8/10 149 lb on 8/8 152 lb on 8/2 149 lb on 7/26   NUTRITION DIAGNOSIS: Inadequate oral intake continues   INTERVENTION:  Discussed option of boost VHC (530 calories, 22 g protein) as option as well for more calories since patient likes boost products.      MONITORING, EVALUATION, GOAL: weight trends, intake   NEXT VISIT: f/u ~ 2 weeks  Shem Plemmons B. Zenia Resides, [place], [place] Registered Dietitian [phone] (mobile)

## 2021-04-05 NOTE — Progress Notes (Signed)
Patient's left hand I still swollen with slight improvement.  Did have some nausea the past week that was relieved with medication.  Feeling chilled with no fever.

## 2021-04-06 ENCOUNTER — Ambulatory Visit
Admission: RE | Admit: 2021-04-06 | Discharge: 2021-04-06 | Disposition: A | Discharge status: Home / Self Care | Payer: Medicare Other | Source: Ambulatory Visit | Attending: Radiation Oncology | Admitting: Radiation Oncology

## 2021-04-06 DIAGNOSIS — R918 Other nonspecific abnormal finding of lung field: Secondary | ICD-10-CM | POA: Diagnosis not present

## 2021-04-06 DIAGNOSIS — Z510 Encounter for antineoplastic radiation therapy: Secondary | ICD-10-CM | POA: Diagnosis present

## 2021-04-07 ENCOUNTER — Telehealth: Payer: Self-pay | Admitting: Primary Care

## 2021-04-07 ENCOUNTER — Ambulatory Visit
Admission: RE | Admit: 2021-04-07 | Discharge: 2021-04-07 | Disposition: A | Discharge status: Home / Self Care | Payer: Medicare Other | Source: Ambulatory Visit | Attending: Radiation Oncology | Admitting: Radiation Oncology

## 2021-04-07 ENCOUNTER — Telehealth: Payer: Self-pay | Admitting: Student

## 2021-04-07 DIAGNOSIS — R918 Other nonspecific abnormal finding of lung field: Secondary | ICD-10-CM | POA: Diagnosis not present

## 2021-04-07 NOTE — Progress Notes (Signed)
[place]  [phone] [phone]  ID: Oneita Kras OB: 10-25-1945  MR#: [id number]  HRC#:[id number]  Patient Care Team: [name], MD as PCP - General (Family Medicine) [name], MD as PCP - Cardiology (Cardiology) [name], RN as Oncology Nurse Navigator  CHIEF COMPLAINT:Stage IIIb lung cancer.  INTERVAL HISTORY: Patient returns to clinic today for further evaluation and consideration of cycle 5 of weekly carboplatinum and Taxol.  She continues to have chronic weakness and fatigue as well as a poor appetite.  She also admits to occasional shortness of breath.  Her left arm swelling is improved.  She denies any pain.  She has no neurologic complaints.  She denies any recent fevers.  She has no chest pain, shortness of breath, cough, or hemoptysis.  She has no nausea, vomiting, constipation, or diarrhea.  She has no urinary complaints.  Patient offers no further specific complaints today.  REVIEW OF SYSTEMS:   Review of Systems  Constitutional:  Positive for malaise/fatigue. Negative for fever and weight loss.  Respiratory:  Positive for shortness of breath. Negative for cough and hemoptysis.   Cardiovascular: Negative.  Negative for chest pain and leg swelling.  Gastrointestinal: Negative.  Negative for abdominal pain and nausea.  Genitourinary: Negative.  Negative for dysuria.  Musculoskeletal: Negative.  Negative for back pain.  Skin: Negative.  Negative for itching and rash.  Neurological:  Positive for weakness. Negative for dizziness, focal weakness and headaches.  Psychiatric/Behavioral: Negative.  The patient is not nervous/anxious.    As per HPI. Otherwise, a complete review of systems is negative.  PAST MEDICAL HISTORY: Past Medical History:  Diagnosis Date   Anxiety    Arthritis    Asthma    Cancer ([place])    benign mass removed   Complication of anesthesia    Dyspnea    Dysrhythmia    Hyperlipidemia     Hypertension    Hypothyroidism    PONV (postoperative nausea and vomiting)    Pre-diabetes    Thyroid disease     PAST SURGICAL HISTORY: Past Surgical History:  Procedure Laterality Date   COLONOSCOPY     FLEXIBLE BRONCHOSCOPY Bilateral 03/06/2021   Procedure: FLEXIBLE BRONCHOSCOPY;  Surgeon: [name], MD;  Location: [facility];  Service: Pulmonary;  Laterality: Bilateral;   PORTA CATH INSERTION N/A 03/13/2021   Procedure: PORTA CATH INSERTION;  Surgeon: [name], MD;  Location: [facility];  Service: Cardiovascular;  Laterality: N/A;   stent placed in kidney     THYROIDECTOMY      FAMILY HISTORY: Family History  Problem Relation Age of Onset   Mesothelioma Father    Rectal cancer Sister     ADVANCED DIRECTIVES (Y/N):  N  HEALTH MAINTENANCE: Social History   Tobacco Use   Smoking status: Former    Types: Cigarettes    Quit date: 2012    Years since quitting: 10.6   Smokeless tobacco: Never  Vaping Use   Vaping Use: Never used  Substance Use Topics   Alcohol use: Not Currently   Drug use: Never     Colonoscopy:  PAP:  Bone density:  Lipid panel:  Allergies  Allergen Reactions   Atorvastatin Other (See Comments)    Joint pain   Latex    Lovastatin Other (See Comments)    Arthritis pain in hands   Penicillins     Current Outpatient Medications  Medication Sig Dispense Refill   ALPRAZolam (XANAX) 0.25 MG  tablet Take 1 tablet (0.25 mg total) by mouth 2 (two) times daily as needed for anxiety. 60 tablet 0   apixaban (ELIQUIS) 5 MG TABS tablet Take 1 tablet (5 mg total) by mouth 2 (two) times daily. 60 tablet 5   Budeson-Glycopyrrol-Formoterol (BREZTRI AEROSPHERE) 160-9-4.8 MCG/ACT AERO Inhale 1 spray into the lungs 2 (two) times daily. 10.7 g 2   dexamethasone (DECADRON) 4 MG tablet Take 1 tablet (4 mg total) by mouth daily. 30 tablet 0   diltiazem (CARDIZEM CD) 240 MG 24 hr capsule Take 1 capsule (240 mg total) by mouth daily. To control  your heart rate from Afib. 30 capsule 2   fluticasone (FLONASE) 50 MCG/ACT nasal spray Place 1 spray into both nostrils in the morning and at bedtime. 15 mL 1   guaiFENesin-dextromethorphan (ROBITUSSIN DM) 100-10 MG/5ML syrup Take 10 mLs by mouth every 6 (six) hours as needed for cough. 118 mL 0   hydrochlorothiazide (HYDRODIURIL) 12.5 MG tablet Take 12.5 mg by mouth daily.     ipratropium (ATROVENT) 0.06 % nasal spray Place 2 sprays into both nostrils 3 (three) times daily.     ipratropium-albuterol (DUONEB) 0.5-2.5 (3) MG/3ML SOLN Take 3 mLs by nebulization every 6 (six) hours as needed. 120 mL 2   levocetirizine (XYZAL) 5 MG tablet Take 5 mg by mouth daily as needed.     levothyroxine (SYNTHROID) 112 MCG tablet Take 112 mcg by mouth daily.     lidocaine-prilocaine (EMLA) cream Apply 1 application topically as needed. 30 g 0   Multiple Vitamin (MULTI-VITAMIN) tablet Take 1 tablet by mouth daily.     ondansetron (ZOFRAN) 8 MG tablet Take 1 tablet (8 mg total) by mouth 2 (two) times daily as needed for refractory nausea / vomiting. 60 tablet 1   PARoxetine (PAXIL) 20 MG tablet Take 30 mg by mouth daily.     potassium chloride SA (KLOR-CON) 20 MEQ tablet Take 1 tablet (20 mEq total) by mouth daily. 30 tablet 0   prochlorperazine (COMPAZINE) 10 MG tablet TAKE 1 TABLET BY MOUTH EVERY 6 HOURS AS NEEDED FOR NAUSEA OR VOMITING 60 tablet 1   ezetimibe (ZETIA) 10 MG tablet TAKE 1 TABLET(10 MG) BY MOUTH EVERY DAY (Patient not taking: No sig reported)     fluconazole (DIFLUCAN) 150 MG tablet Take 1 tablet (150 mg total) by mouth daily. (Patient not taking: No sig reported) 7 tablet 0   No current facility-administered medications for this visit.   Facility-Administered Medications Ordered in Other Visits  Medication Dose Route Frequency Provider Last Rate Last Admin   sodium chloride flush (NS) 0.9 % injection 10 mL  10 mL Intravenous PRN [name], MD   10 mL at 04/12/21 0851     OBJECTIVE: Vitals:   04/12/21 0915  BP: 125/80  Resp: 18  Temp: (!) 97.2 F (36.2 C)     Body mass index is 26.07 kg/m.    ECOG FS:2 - Symptomatic, <50% confined to bed  General: Well-developed, well-nourished, no acute distress.  Sitting in a wheelchair. Eyes: Pink conjunctiva, anicteric sclera. HEENT: Normocephalic, moist mucous membranes. Lungs: No audible wheezing or coughing. Heart: Regular rate and rhythm. Abdomen: Soft, nontender, no obvious distention. Musculoskeletal: No edema, cyanosis, or clubbing. Neuro: Alert, answering all questions appropriately. Cranial nerves grossly intact. Skin: No rashes or petechiae noted. Psych: Normal affect.   LAB RESULTS:  Lab Results  Component Value Date   NA 131 (L) 04/12/2021   K 4.0 04/12/2021  CL 96 (L) 04/12/2021   CO2 27 04/12/2021   GLUCOSE 118 (H) 04/12/2021   BUN 22 04/12/2021   CREATININE 0.84 04/12/2021   CALCIUM 6.7 (L) 04/12/2021   PROT 6.4 (L) 04/12/2021   ALBUMIN 2.9 (L) 04/12/2021   AST 19 04/12/2021   ALT 33 04/12/2021   ALKPHOS 56 04/12/2021   BILITOT 0.2 (L) 04/12/2021   GFRNONAA >60 04/12/2021    Lab Results  Component Value Date   WBC 5.0 04/12/2021   NEUTROABS 4.5 04/12/2021   HGB 10.7 (L) 04/12/2021   HCT 31.7 (L) 04/12/2021   MCV 87.8 04/12/2021   PLT 114 (L) 04/12/2021     STUDIES: US Venous Img Upper Uni Left  Result Date: 03/29/2021 CLINICAL DATA:  left arm swelling/receiving treatment for lung cancer EXAM: LEFT UPPER EXTREMITY VENOUS DOPPLER ULTRASOUND TECHNIQUE: Gray-scale sonography with graded compression, as well as color Doppler and duplex ultrasound were performed to evaluate the upper extremity deep venous system from the level of the subclavian vein and including the jugular, axillary, basilic, radial, ulnar and upper cephalic vein. Spectral Doppler was utilized to evaluate flow at rest and with distal augmentation maneuvers. COMPARISON:  None. FINDINGS: Contralateral  Subclavian Vein: Respiratory phasicity is normal and symmetric with the symptomatic side. No evidence of thrombus. Normal compressibility. Internal Jugular Vein: No evidence of thrombus. Normal compressibility, respiratory phasicity and response to augmentation. Subclavian Vein: No evidence of thrombus. Normal compressibility, respiratory phasicity and response to augmentation. Axillary Vein: No evidence of thrombus. Normal compressibility, respiratory phasicity and response to augmentation. Cephalic Vein: No evidence of thrombus. Normal compressibility, respiratory phasicity and response to augmentation. Basilic Vein: No evidence of thrombus. Normal compressibility, respiratory phasicity and response to augmentation. Brachial Veins: No evidence of thrombus. Normal compressibility, respiratory phasicity and response to augmentation. Radial Veins: No evidence of thrombus. Normal compressibility, respiratory phasicity and response to augmentation. Ulnar Veins: No evidence of thrombus. Normal compressibility, respiratory phasicity and response to augmentation. Other Findings:  None visualized. IMPRESSION: No evidence of DVT within the left upper extremity. Electronically Signed   By: Albin Felling M.D.   On: 03/29/2021 14:17   MM 3D SCREEN BREAST BILATERAL  Result Date: 04/05/2021 CLINICAL DATA:  Screening. EXAM: DIGITAL SCREENING BILATERAL MAMMOGRAM WITH TOMOSYNTHESIS AND CAD TECHNIQUE: Bilateral screening digital craniocaudal and mediolateral oblique mammograms were obtained. Bilateral screening digital breast tomosynthesis was performed. The images were evaluated with computer-aided detection. COMPARISON:  Previous exam(s). ACR Breast Density Category b: There are scattered areas of fibroglandular density. FINDINGS: In the right breast, a possible asymmetry warrants further evaluation. In the left breast, no findings suspicious for malignancy. IMPRESSION: Further evaluation is suggested for possible asymmetry  in the right breast. RECOMMENDATION: Diagnostic mammogram and possibly ultrasound of the right breast. (Code:[id number]) The patient will be contacted regarding the findings, and additional imaging will be scheduled. BI-RADS CATEGORY  0: Incomplete. Need additional imaging evaluation and/or prior mammograms for comparison. Electronically Signed   By: Audie Pinto M.D.   On: 04/05/2021 16:19  ECHOCARDIOGRAM COMPLETE  Result Date: 03/15/2021    ECHOCARDIOGRAM REPORT   Patient Name:   Brittany Wheeler Date of Exam: 03/14/2021 Medical Rec #:  [id number]        Height:       61.0 in Accession #:    [id number]       Weight:       149.0 lb Date of Birth:  July 02, 1946        BSA:  1.667 m Patient Age:    75 years         BP:           130/64 mmHg Patient Gender: F                HR:           95 bpm. Exam Location:  [facility] Procedure: 2D Echo, Color Doppler, Cardiac Doppler and Intracardiac            Opacification Agent Indications:    I48.91 Atrial fibrillation  History:        Patient has no prior history of Echocardiogram examinations.                 Risk Factors:Hypertension, Dyslipidemia and Former Smoker.  Sonographer:    Charmayne Sheer Referring Phys: [id number] BRIAN AGBOR-ETANG  Sonographer Comments: Technically difficult study due to poor echo windows. Image acquisition challenging due to patient body habitus. IMPRESSIONS  1. Left ventricular ejection fraction, by estimation, is >55%. The left ventricle has normal function. Left ventricular endocardial border not optimally defined to evaluate regional wall motion. There is mild left ventricular hypertrophy. Left ventricular diastolic parameters are consistent with Grade I diastolic dysfunction (impaired relaxation).  2. Right ventricular systolic function is normal. The right ventricular size is normal. Mildly increased right ventricular wall thickness.  3. The mitral valve was not well visualized. No evidence of mitral valve regurgitation. No evidence of  mitral stenosis.  4. Tricuspid valve regurgitation is mild to moderate.  5. The aortic valve was not well visualized. Aortic valve regurgitation is not visualized. No aortic stenosis is present.  6. The inferior vena cava is normal in size with greater than 50% respiratory variability, suggesting right atrial pressure of 3 mmHg. FINDINGS  Left Ventricle: Left ventricular ejection fraction, by estimation, is >55%. The left ventricle has normal function. Left ventricular endocardial border not optimally defined to evaluate regional wall motion. Definity contrast agent was given IV to delineate the left ventricular endocardial borders. The left ventricular internal cavity size was normal in size. There is mild left ventricular hypertrophy. Left ventricular diastolic parameters are consistent with Grade I diastolic dysfunction (impaired relaxation). Right Ventricle: The right ventricular size is normal. Mildly increased right ventricular wall thickness. Right ventricular systolic function is normal. Left Atrium: Left atrial size was normal in size. Right Atrium: Right atrial size was not well visualized. Pericardium: Trivial pericardial effusion is present. Mitral Valve: The mitral valve was not well visualized. Mild mitral annular calcification. No evidence of mitral valve regurgitation. No evidence of mitral valve stenosis. MV peak gradient, 2.4 mmHg. The mean mitral valve gradient is 1.0 mmHg. Tricuspid Valve: The tricuspid valve is not well visualized. Tricuspid valve regurgitation is mild to moderate. Aortic Valve: The aortic valve was not well visualized. Aortic valve regurgitation is not visualized. No aortic stenosis is present. Aortic valve mean gradient measures 3.0 mmHg. Aortic valve peak gradient measures 6.2 mmHg. Aortic valve area, by VTI measures 2.10 cm. Pulmonic Valve: The pulmonic valve was not well visualized. Pulmonic valve regurgitation is not visualized. No evidence of pulmonic stenosis. Aorta:  The aortic root is normal in size and structure. Pulmonary Artery: The pulmonary artery is not well seen. Venous: The inferior vena cava is normal in size with greater than 50% respiratory variability, suggesting right atrial pressure of 3 mmHg. IAS/Shunts: The interatrial septum was not well visualized.  LEFT VENTRICLE PLAX 2D LVIDd:  3.15 cm  Diastology LVIDs:         2.20 cm  LV e' medial:    5.33 cm/s LV PW:         1.10 cm  LV E/e' medial:  8.7 LV IVS:        0.95 cm  LV e' lateral:   6.53 cm/s LVOT diam:     1.90 cm  LV E/e' lateral: 7.1 LV SV:         39 LV SV Index:   24 LVOT Area:     2.84 cm  RIGHT VENTRICLE RV Basal diam:  3.00 cm LEFT ATRIUM           Index LA diam:      2.40 cm 1.44 cm/m LA Vol (A4C): 11.0 ml 6.60 ml/m  AORTIC VALVE                   PULMONIC VALVE AV Area (Vmax):    2.10 cm    PV Vmax:       0.94 m/s AV Area (Vmean):   2.03 cm    PV Vmean:      64.600 cm/s AV Area (VTI):     2.10 cm    PV VTI:        0.145 m AV Vmax:           124.00 cm/s PV Peak grad:  3.5 mmHg AV Vmean:          81.700 cm/s PV Mean grad:  2.0 mmHg AV VTI:            0.188 m AV Peak Grad:      6.2 mmHg AV Mean Grad:      3.0 mmHg LVOT Vmax:         92.00 cm/s LVOT Vmean:        58.600 cm/s LVOT VTI:          0.139 m LVOT/AV VTI ratio: 0.74  AORTA Ao Root diam: 2.70 cm MITRAL VALVE MV Area (PHT): 6.07 cm    SHUNTS MV Area VTI:   3.66 cm    Systemic VTI:  0.14 m MV Peak grad:  2.4 mmHg    Systemic Diam: 1.90 cm MV Mean grad:  1.0 mmHg MV Vmax:       0.78 m/s MV Vmean:      45.4 cm/s MV Decel Time: 125 msec MV E velocity: 46.30 cm/s MV A velocity: 84.80 cm/s MV E/A ratio:  0.55 Christopher End MD Electronically signed by Nelva Bush MD Signature Date/Time: 03/15/2021/1:02:31 PM    Final    LONG TERM MONITOR (3-14 DAYS)  Result Date: 04/07/2021 Patch Wear Time:  14 days and 0 hours (2022-08-10T17:30:29-0400 to 2022-08-24T17:30:33-0400) Patient had a min HR of 51 bpm, max HR of 176 bpm, and avg HR of  92 bpm. Predominant underlying rhythm was Sinus Rhythm. 8 Supraventricular Tachycardia runs occurred, the run with the fastest interval lasting 9 beats with a max rate of 176 bpm, the longest lasting 18 beats with an avg rate of 125 bpm. Atrial Fibrillation occurred (22% burden), ranging from 54-169 bpm (avg of 92 bpm), the longest lasting 3 hours 54 mins with an avg rate of 86 bpm.   MM Outside Films Mammo  Result Date: 04/04/2021 This examination belongs to an outside facility and is stored here for comparison purposes only.  Contact the originating outside institution for any associated report or interpretation.   ASSESSMENT: Stage IIIb lung  cancer.  PLAN:    Stage IIIb lung cancer: CT scan results from February 27, 2021 reviewed independently and reported as above with bulky 7 cm right middle lobe mass invading right hilum with bulky mediastinal lymphadenopathy highly suspicious for underlying bronchogenic malignancy.  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  MRI of the brain on February 28, 2021 reviewed independently with no obvious metastatic disease.  PET scan on March 14, 2021 confirmed staging with no obvious evidence of metastatic disease.  Patient has now had port placement.  Proceed with cycle 5 of weekly carboplatinum and Taxol today.  Continue daily XRT completing treatment on April 27, 2021 plan to do 6-8 cycles and then transition to maintenance durvalumab for 1 year.  Return to clinic in 1 week for further evaluation and consideration of cycle 6. Atrial fibrillation: Continue Cardizem and Eliquis. Appreciate cardiology input. Anxiety: Chronic and unchanged.  Continue Xanax as prescribed. Weakness and fatigue: Patient has home health.  Appreciate palliative care input. Left arm swelling: Improved.  Ultrasound negative for DVT.  Monitor. Poor appetite: Improved.  Continue 33m dexamethasone daily. Hypokalemia: Resolved.  Continue oral potassium  supplementation.  Patient expressed understanding and was in agreement with this plan. She also understands that She can call clinic at any time with any questions, concerns, or complaints.   Cancer Staging Primary cancer of right middle lobe of lung ([facility] Staging form: Lung, AJCC 8th Edition - Clinical stage from 03/07/2021: Stage IIIB (cT4, cN2, cM0) - Signed by FLloyd Huger MD on 03/07/2021 Stage prefix: Initial diagnosis  TLloyd Huger MD   04/13/2021 8:15 AM

## 2021-04-07 NOTE — Telephone Encounter (Signed)
Entered in error

## 2021-04-07 NOTE — Telephone Encounter (Signed)
Attempted to contact patient on her home and cell number to offer to schedule a Palliative Consult, no answer.  Left message at both numbers with reason for call along with my name and call back number requesting a return call to schedule visit.

## 2021-04-11 ENCOUNTER — Ambulatory Visit
Admission: RE | Admit: 2021-04-11 | Discharge: 2021-04-11 | Disposition: A | Discharge status: Home / Self Care | Payer: Medicare Other | Source: Ambulatory Visit | Attending: Radiation Oncology | Admitting: Radiation Oncology

## 2021-04-11 ENCOUNTER — Encounter: Payer: Self-pay | Admitting: Hospice and Palliative Medicine

## 2021-04-11 DIAGNOSIS — R918 Other nonspecific abnormal finding of lung field: Secondary | ICD-10-CM | POA: Diagnosis not present

## 2021-04-12 ENCOUNTER — Inpatient Hospital Stay: Payer: Medicare Other | Attending: Oncology

## 2021-04-12 ENCOUNTER — Inpatient Hospital Stay (HOSPITAL_BASED_OUTPATIENT_CLINIC_OR_DEPARTMENT_OTHER): Payer: Medicare Other | Admitting: Oncology

## 2021-04-12 ENCOUNTER — Inpatient Hospital Stay (HOSPITAL_BASED_OUTPATIENT_CLINIC_OR_DEPARTMENT_OTHER): Payer: Medicare Other | Admitting: Hospice and Palliative Medicine

## 2021-04-12 ENCOUNTER — Ambulatory Visit
Admission: RE | Admit: 2021-04-12 | Discharge: 2021-04-12 | Disposition: A | Discharge status: Home / Self Care | Payer: Medicare Other | Source: Ambulatory Visit | Attending: Radiation Oncology | Admitting: Radiation Oncology

## 2021-04-12 ENCOUNTER — Inpatient Hospital Stay: Payer: Medicare Other

## 2021-04-12 ENCOUNTER — Other Ambulatory Visit: Payer: Self-pay

## 2021-04-12 VITALS — BP 125/80 | Temp 97.2°F | Resp 18 | Wt 138.0 lb

## 2021-04-12 DIAGNOSIS — Z808 Family history of malignant neoplasm of other organs or systems: Secondary | ICD-10-CM | POA: Insufficient documentation

## 2021-04-12 DIAGNOSIS — Z79899 Other long term (current) drug therapy: Secondary | ICD-10-CM | POA: Insufficient documentation

## 2021-04-12 DIAGNOSIS — R531 Weakness: Secondary | ICD-10-CM | POA: Insufficient documentation

## 2021-04-12 DIAGNOSIS — I471 Supraventricular tachycardia: Secondary | ICD-10-CM | POA: Diagnosis not present

## 2021-04-12 DIAGNOSIS — R630 Anorexia: Secondary | ICD-10-CM | POA: Diagnosis not present

## 2021-04-12 DIAGNOSIS — Z880 Allergy status to penicillin: Secondary | ICD-10-CM | POA: Insufficient documentation

## 2021-04-12 DIAGNOSIS — Z515 Encounter for palliative care: Secondary | ICD-10-CM | POA: Diagnosis not present

## 2021-04-12 DIAGNOSIS — I10 Essential (primary) hypertension: Secondary | ICD-10-CM | POA: Diagnosis not present

## 2021-04-12 DIAGNOSIS — I4891 Unspecified atrial fibrillation: Secondary | ICD-10-CM | POA: Diagnosis not present

## 2021-04-12 DIAGNOSIS — F419 Anxiety disorder, unspecified: Secondary | ICD-10-CM | POA: Insufficient documentation

## 2021-04-12 DIAGNOSIS — R0602 Shortness of breath: Secondary | ICD-10-CM | POA: Diagnosis not present

## 2021-04-12 DIAGNOSIS — E871 Hypo-osmolality and hyponatremia: Secondary | ICD-10-CM | POA: Insufficient documentation

## 2021-04-12 DIAGNOSIS — Z7952 Long term (current) use of systemic steroids: Secondary | ICD-10-CM | POA: Insufficient documentation

## 2021-04-12 DIAGNOSIS — R5383 Other fatigue: Secondary | ICD-10-CM | POA: Insufficient documentation

## 2021-04-12 DIAGNOSIS — Z87891 Personal history of nicotine dependence: Secondary | ICD-10-CM | POA: Diagnosis not present

## 2021-04-12 DIAGNOSIS — R059 Cough, unspecified: Secondary | ICD-10-CM | POA: Insufficient documentation

## 2021-04-12 DIAGNOSIS — C342 Malignant neoplasm of middle lobe, bronchus or lung: Secondary | ICD-10-CM

## 2021-04-12 DIAGNOSIS — D649 Anemia, unspecified: Secondary | ICD-10-CM | POA: Insufficient documentation

## 2021-04-12 DIAGNOSIS — M7989 Other specified soft tissue disorders: Secondary | ICD-10-CM | POA: Diagnosis not present

## 2021-04-12 DIAGNOSIS — Z7901 Long term (current) use of anticoagulants: Secondary | ICD-10-CM | POA: Diagnosis not present

## 2021-04-12 DIAGNOSIS — D72819 Decreased white blood cell count, unspecified: Secondary | ICD-10-CM | POA: Insufficient documentation

## 2021-04-12 DIAGNOSIS — R918 Other nonspecific abnormal finding of lung field: Secondary | ICD-10-CM | POA: Diagnosis not present

## 2021-04-12 LAB — COMPREHENSIVE METABOLIC PANEL
ALT: 33 U/L (ref 0–44)
AST: 19 U/L (ref 15–41)
Albumin: 2.9 g/dL — ABNORMAL LOW (ref 3.5–5.0)
Alkaline Phosphatase: 56 U/L (ref 38–126)
Anion gap: 8 (ref 5–15)
BUN: 22 mg/dL (ref 8–23)
CO2: 27 mmol/L (ref 22–32)
Calcium: 6.7 mg/dL — ABNORMAL LOW (ref 8.9–10.3)
Chloride: 96 mmol/L — ABNORMAL LOW (ref 98–111)
Creatinine, Ser: 0.84 mg/dL (ref 0.44–1.00)
GFR, Estimated: >60 mL/min (ref >60)
Glucose, Bld: 118 mg/dL — ABNORMAL HIGH (ref 70–99)
Potassium: 4 mmol/L (ref 3.5–5.1)
Sodium: 131 mmol/L — ABNORMAL LOW (ref 135–145)
Total Bilirubin: 0.2 mg/dL — ABNORMAL LOW (ref 0.3–1.2)
Total Protein: 6.4 g/dL — ABNORMAL LOW (ref 6.5–8.1)

## 2021-04-12 LAB — CBC WITH DIFFERENTIAL/PLATELET
Abs Immature Granulocytes: 0.1 10*3/uL — ABNORMAL HIGH (ref 0.00–0.07)
Basophils Absolute: 0 10*3/uL (ref 0.0–0.1)
Basophils Relative: 0 %
Eosinophils Absolute: 0 10*3/uL (ref 0.0–0.5)
Eosinophils Relative: 0 %
HCT: 31.7 % — ABNORMAL LOW (ref 36.0–46.0)
Hemoglobin: 10.7 g/dL — ABNORMAL LOW (ref 12.0–15.0)
Immature Granulocytes: 2 %
Lymphocytes Relative: 4 %
Lymphs Abs: 0.2 10*3/uL — ABNORMAL LOW (ref 0.7–4.0)
MCH: 29.6 pg (ref 26.0–34.0)
MCHC: 33.8 g/dL (ref 30.0–36.0)
MCV: 87.8 fL (ref 80.0–100.0)
Monocytes Absolute: 0.3 10*3/uL (ref 0.1–1.0)
Monocytes Relative: 5 %
Neutro Abs: 4.5 10*3/uL (ref 1.7–7.7)
Neutrophils Relative %: 89 %
Platelets: 114 10*3/uL — ABNORMAL LOW (ref 150–400)
RBC: 3.61 MIL/uL — ABNORMAL LOW (ref 3.87–5.11)
RDW: 14.2 % (ref 11.5–15.5)
WBC: 5 10*3/uL (ref 4.0–10.5)
nRBC: 0 % (ref 0.0–0.2)

## 2021-04-12 MED ORDER — HEPARIN SOD (PORK) LOCK FLUSH 100 UNIT/ML IV SOLN
INTRAVENOUS | Status: AC
  Filled 2021-04-12: qty 5

## 2021-04-12 MED ORDER — FAMOTIDINE 20 MG IN NS 100 ML IVPB
20.0000 mg | Freq: Once | INTRAVENOUS | Status: AC
  Administered 2021-04-12: 20 mg via INTRAVENOUS
  Filled 2021-04-12: qty 20

## 2021-04-12 MED ORDER — SODIUM CHLORIDE 0.9 % IV SOLN
45.0000 mg/m2 | Freq: Once | INTRAVENOUS | Status: AC
  Administered 2021-04-12: 78 mg via INTRAVENOUS
  Filled 2021-04-12: qty 13

## 2021-04-12 MED ORDER — SODIUM CHLORIDE 0.9 % IV SOLN
10.0000 mg | Freq: Once | INTRAVENOUS | Status: AC
  Administered 2021-04-12: 10 mg via INTRAVENOUS
  Filled 2021-04-12: qty 10

## 2021-04-12 MED ORDER — HEPARIN SOD (PORK) LOCK FLUSH 100 UNIT/ML IV SOLN
500.0000 [IU] | Freq: Once | INTRAVENOUS | Status: AC
  Administered 2021-04-12: 500 [IU] via INTRAVENOUS
  Filled 2021-04-12: qty 5

## 2021-04-12 MED ORDER — SODIUM CHLORIDE 0.9 % IV SOLN
Freq: Once | INTRAVENOUS | Status: AC
  Filled 2021-04-12: qty 250

## 2021-04-12 MED ORDER — HEPARIN SOD (PORK) LOCK FLUSH 100 UNIT/ML IV SOLN
500.0000 [IU] | Freq: Once | INTRAVENOUS | Status: DC | PRN
  Filled 2021-04-12: qty 5

## 2021-04-12 MED ORDER — SODIUM CHLORIDE 0.9% FLUSH
10.0000 mL | INTRAVENOUS | Status: AC | PRN
  Administered 2021-04-12: 10 mL via INTRAVENOUS
  Filled 2021-04-12: qty 10

## 2021-04-12 MED ORDER — SODIUM CHLORIDE 0.9 % IV SOLN
159.2000 mg | Freq: Once | INTRAVENOUS | Status: AC
  Administered 2021-04-12: 160 mg via INTRAVENOUS
  Filled 2021-04-12: qty 16

## 2021-04-12 MED ORDER — PALONOSETRON HCL INJECTION 0.25 MG/5ML
0.2500 mg | Freq: Once | INTRAVENOUS | Status: AC
  Administered 2021-04-12: 0.25 mg via INTRAVENOUS
  Filled 2021-04-12: qty 5

## 2021-04-12 MED ORDER — DIPHENHYDRAMINE HCL 50 MG/ML IJ SOLN
25.0000 mg | Freq: Once | INTRAMUSCULAR | Status: AC
  Administered 2021-04-12: 25 mg via INTRAVENOUS
  Filled 2021-04-12: qty 1

## 2021-04-12 NOTE — Patient Instructions (Signed)
[facility]  Discharge Instructions: Thank you for choosing [facility] to provide your oncology and hematology care.  If you have a lab appointment with the [facility], please go directly to the [facility] and check in at the registration area.  Wear comfortable clothing and clothing appropriate for easy access to any Portacath or PICC line.   We strive to give you quality time with your provider. You may need to reschedule your appointment if you arrive late (15 or more minutes).  Arriving late affects you and other patients whose appointments are after yours.  Also, if you miss three or more appointments without notifying the office, you may be dismissed from the clinic at the provider's discretion.      For prescription refill requests, have your pharmacy contact our office and allow 72 hours for refills to be completed.    Today you received the following chemotherapy and/or immunotherapy agents: Taxol, Carboplatin      To help prevent nausea and vomiting after your treatment, we encourage you to take your nausea medication as directed.  BELOW ARE SYMPTOMS THAT SHOULD BE REPORTED IMMEDIATELY: *FEVER GREATER THAN 100.4 F (38 C) OR HIGHER *CHILLS OR SWEATING *NAUSEA AND VOMITING THAT IS NOT CONTROLLED WITH YOUR NAUSEA MEDICATION *UNUSUAL SHORTNESS OF BREATH *UNUSUAL BRUISING OR BLEEDING *URINARY PROBLEMS (pain or burning when urinating, or frequent urination) *BOWEL PROBLEMS (unusual diarrhea, constipation, pain near the anus) TENDERNESS IN MOUTH AND THROAT WITH OR WITHOUT PRESENCE OF ULCERS (sore throat, sores in mouth, or a toothache) UNUSUAL RASH, SWELLING OR PAIN  UNUSUAL VAGINAL DISCHARGE OR ITCHING   Items with * indicate a potential emergency and should be followed up as soon as possible or go to the Emergency Department if any problems should occur.  Please show the CHEMOTHERAPY ALERT CARD or IMMUNOTHERAPY ALERT CARD at  check-in to the Emergency Department and triage nurse.  Should you have questions after your visit or need to cancel or reschedule your appointment, please contact [facility]  [phone] and follow the prompts.  Office hours are 8:00 a.m. to 4:30 p.m. Monday - Friday. Please note that voicemails left after 4:00 p.m. may not be returned until the following business day.  We are closed weekends and major holidays. You have access to a nurse at all times for urgent questions. Please call the main number to the clinic [phone] and follow the prompts.  For any non-urgent questions, you may also contact your provider using MyChart. We now offer e-Visits for anyone 70 and older to request care online for non-urgent symptoms. For details visit [email or web address].   Also download the MyChart app! Go to the app store, search "MyChart", open the app, select [facility], and log in with your MyChart username and password.  Due to Covid, a mask is required upon entering the hospital/clinic. If you do not have a mask, one will be given to you upon arrival. For doctor visits, patients may have 1 support person aged 88 or older with them. For treatment visits, patients cannot have anyone with them due to current Covid guidelines and our immunocompromised population.

## 2021-04-12 NOTE — Progress Notes (Signed)
[place]  Telephone:([phone] [phone]   Name: Brittany Wheeler Date: 04/12/2021 MRN: [id number]  DOB: 01-23-1946  Patient Care Team: [name], MD as PCP - General (Family Medicine) [name], MD as PCP - Cardiology (Cardiology) [name], RN as Oncology Nurse Navigator    REASON FOR CONSULTATION: Brittany Wheeler is a 75 y.o. 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 Patient was hospitalized in July 2022 for shortness of breath and was found to have a large bulky central right-sided lung mass.  Patient underwent bronchoscopy on 03/06/2021 with pathology favoring squamous cell carcinoma.  PET scan on 03/13/2021 revealed hypermetabolic large right middle lobe lung mass with extensive invasion of the right hilum and mediastinum.  Patient had weekly hypermetabolic small bilateral supraclavicular nodes but no findings to suggest distal metastatic disease.  Palliative care was consulted to address goals and manage ongoing symptoms.  SOCIAL HISTORY:     reports that she quit smoking about 10 years ago. Her smoking use included cigarettes. She has never used smokeless tobacco. She reports that she does not currently use alcohol. She reports that she does not use drugs.  Patient was never married and has no children.  She lives at home alone with two Deneen Harts and [place].  She has a neighbor, Lawyer, who is her healthcare power of attorney.  Patient has a cousin from out of state.  ADVANCE DIRECTIVES:  On file  CODE STATUS: DNR  PAST MEDICAL HISTORY: Past Medical History:  Diagnosis Date   Anxiety    Arthritis    Asthma    Cancer ([place])    benign mass removed   Complication of anesthesia    Dyspnea    Dysrhythmia     Hyperlipidemia    Hypertension    Hypothyroidism    PONV (postoperative nausea and vomiting)    Pre-diabetes    Thyroid disease     PAST SURGICAL HISTORY:  Past Surgical History:  Procedure Laterality Date   COLONOSCOPY     FLEXIBLE BRONCHOSCOPY Bilateral 03/06/2021   Procedure: FLEXIBLE BRONCHOSCOPY;  Surgeon: [name], MD;  Location: [facility];  Service: Pulmonary;  Laterality: Bilateral;   PORTA CATH INSERTION N/A 03/13/2021   Procedure: PORTA CATH INSERTION;  Surgeon: [name], MD;  Location: [facility];  Service: Cardiovascular;  Laterality: N/A;   stent placed in kidney     THYROIDECTOMY      HEMATOLOGY/ONCOLOGY HISTORY:  Oncology History  Primary cancer of right middle lobe of lung ([place])  03/07/2021 Initial Diagnosis   Primary cancer of right middle lobe of lung ([place])   03/07/2021 Cancer Staging   Staging form: Lung, AJCC 8th Edition - Clinical stage from 03/07/2021: Stage IIIB (cT4, cN2, cM0) - Signed by [name], MD on 03/07/2021 Stage prefix: Initial diagnosis   03/15/2021 -  Chemotherapy    Patient is on Treatment Plan: LUNG CARBOPLATIN / PACLITAXEL + XRT Q7D        ALLERGIES:  is allergic to atorvastatin, latex, lovastatin, and penicillins.  MEDICATIONS:  Current Outpatient Medications  Medication Sig Dispense Refill   ALPRAZolam (XANAX) 0.25 MG tablet Take 1 tablet (0.25 mg total) by mouth 2 (two) times daily as needed for anxiety. 60 tablet 0   apixaban (ELIQUIS) 5 MG TABS tablet Take 1 tablet (5 mg  total) by mouth 2 (two) times daily. 60 tablet 5   Budeson-Glycopyrrol-Formoterol (BREZTRI AEROSPHERE) 160-9-4.8 MCG/ACT AERO Inhale 1 spray into the lungs 2 (two) times daily. 10.7 g 2   dexamethasone (DECADRON) 4 MG tablet Take 1 tablet (4 mg total) by mouth daily. 30 tablet 0   diltiazem (CARDIZEM CD) 240 MG 24 hr capsule Take 1 capsule (240 mg total) by mouth daily. To control your heart rate from Afib. 30 capsule 2   ezetimibe (ZETIA) 10  MG tablet TAKE 1 TABLET(10 MG) BY MOUTH EVERY DAY (Patient not taking: No sig reported)     fluconazole (DIFLUCAN) 150 MG tablet Take 1 tablet (150 mg total) by mouth daily. (Patient not taking: No sig reported) 7 tablet 0   fluticasone (FLONASE) 50 MCG/ACT nasal spray Place 1 spray into both nostrils in the morning and at bedtime. 15 mL 1   guaiFENesin-dextromethorphan (ROBITUSSIN DM) 100-10 MG/5ML syrup Take 10 mLs by mouth every 6 (six) hours as needed for cough. 118 mL 0   hydrochlorothiazide (HYDRODIURIL) 12.5 MG tablet Take 12.5 mg by mouth daily.     ipratropium (ATROVENT) 0.06 % nasal spray Place 2 sprays into both nostrils 3 (three) times daily.     ipratropium-albuterol (DUONEB) 0.5-2.5 (3) MG/3ML SOLN Take 3 mLs by nebulization every 6 (six) hours as needed. 120 mL 2   levocetirizine (XYZAL) 5 MG tablet Take 5 mg by mouth daily as needed.     levothyroxine (SYNTHROID) 112 MCG tablet Take 112 mcg by mouth daily.     lidocaine-prilocaine (EMLA) cream Apply 1 application topically as needed. 30 g 0   Multiple Vitamin (MULTI-VITAMIN) tablet Take 1 tablet by mouth daily.     ondansetron (ZOFRAN) 8 MG tablet Take 1 tablet (8 mg total) by mouth 2 (two) times daily as needed for refractory nausea / vomiting. 60 tablet 1   PARoxetine (PAXIL) 20 MG tablet Take 30 mg by mouth daily.     potassium chloride SA (KLOR-CON) 20 MEQ tablet Take 1 tablet (20 mEq total) by mouth daily. 30 tablet 0   prochlorperazine (COMPAZINE) 10 MG tablet TAKE 1 TABLET BY MOUTH EVERY 6 HOURS AS NEEDED FOR NAUSEA OR VOMITING 60 tablet 1   No current facility-administered medications for this visit.   Facility-Administered Medications Ordered in Other Visits  Medication Dose Route Frequency Provider Last Rate Last Admin   CARBOplatin (PARAPLATIN) 160 mg in sodium chloride 0.9 % 100 mL chemo infusion  160 mg Intravenous Once [name], MD       heparin lock flush 100 unit/mL  500 Units Intravenous Once [name],  [name], MD       heparin lock flush 100 unit/mL  500 Units Intracatheter Once PRN [name], MD       PACLitaxel (TAXOL) 78 mg in sodium chloride 0.9 % 250 mL chemo infusion (</= 60m/m2)  45 mg/m2 (Treatment Plan Recorded) Intravenous Once FLloyd Huger MD 263 mL/hr at 04/12/21 1111 78 mg at 04/12/21 1111   sodium chloride flush (NS) 0.9 % injection 10 mL  10 mL Intravenous PRN FLloyd Huger MD   10 mL at 04/12/21 0851    VITAL SIGNS: There were no vitals taken for this visit. There were no vitals filed for this visit.  Estimated body mass index is 26.07 kg/m as calculated from the following:   Height as of 03/13/21: _0  (1.549 m).   Weight as of an earlier encounter on 04/12/21: 138 lb (  62.6 kg).  LABS: CBC:    Component Value Date/Time   WBC 5.0 04/12/2021 0840   HGB 10.7 (L) 04/12/2021 0840   HCT 31.7 (L) 04/12/2021 0840   PLT 114 (L) 04/12/2021 0840   MCV 87.8 04/12/2021 0840   NEUTROABS 4.5 04/12/2021 0840   LYMPHSABS 0.2 (L) 04/12/2021 0840   MONOABS 0.3 04/12/2021 0840   EOSABS 0.0 04/12/2021 0840   BASOSABS 0.0 04/12/2021 0840   Comprehensive Metabolic Panel:    Component Value Date/Time   NA 131 (L) 04/12/2021 0840   K 4.0 04/12/2021 0840   CL 96 (L) 04/12/2021 0840   CO2 27 04/12/2021 0840   BUN 22 04/12/2021 0840   CREATININE 0.84 04/12/2021 0840   GLUCOSE 118 (H) 04/12/2021 0840   CALCIUM 6.7 (L) 04/12/2021 0840   AST 19 04/12/2021 0840   ALT 33 04/12/2021 0840   ALKPHOS 56 04/12/2021 0840   BILITOT 0.2 (L) 04/12/2021 0840   PROT 6.4 (L) 04/12/2021 0840   ALBUMIN 2.9 (L) 04/12/2021 0840    RADIOGRAPHIC STUDIES: NM PET Image Initial (PI) Skull Base To Thigh  Result Date: 03/14/2021 CLINICAL DATA:  Initial treatment strategy for right lung mass and adenopathy. EXAM: NUCLEAR MEDICINE PET SKULL BASE TO THIGH TECHNIQUE: 8.1 mCi F-18 FDG was injected intravenously. Full-ring PET imaging was performed from the skull base to thigh after  the radiotracer. CT data was obtained and used for attenuation correction and anatomic localization. Fasting blood glucose: 105 mg/dl COMPARISON:  Chest CT 02/27/2021 FINDINGS: Mediastinal blood pool activity: SUV max 2.51 Liver activity: SUV max NA NECK: No hypermetabolic lymph nodes in the neck. Incidental CT findings: none CHEST: Large right middle lobe lung mass is hypermetabolic with SUV max of 60.10. This is invading the right hilum and mediastinum. Extensive right paratracheal adenopathy with SUV max of 24.41. There is also contralateral AP window adenopathy. Subcarinal adenopathy has an SUV max of 17.69. Small right pleural effusion without hypermetabolic pleural nodules. Few patchy E ill-defined sub solid nodular opacities appear relatively stable. No definite hypermetabolism but recommend CT surveillance. There are several small bilateral supraclavicular nodes which are weakly hypermetabolic. SUV max ranging between 2.5 and 2.6. Incidental CT findings: Vascular calcifications. ABDOMEN/PELVIS: No abnormal hypermetabolic activity within the liver, pancreas, adrenal glands, or spleen. No hypermetabolic lymph nodes in the abdomen or pelvis. Incidental CT findings: Left pelvic kidney is noted with moderate hydronephrosis. SKELETON: No findings suspicious for osseous metastatic disease. Incidental CT findings: none IMPRESSION: 1. Large right middle lobe lung mass is hypermetabolic and consistent with neoplasm. 2. Extensive invasion/involvement of the right hilum and mediastinum. 3. Patchy ground-glass nodules in both lungs will require CT surveillance. 4. Small bilateral supraclavicular nodes are weakly hypermetabolic and suspicious for metastatic adenopathy. 5. No findings to suggest abdominal/pelvic metastatic disease or osseous metastatic disease. Electronically Signed   By: Marijo Sanes M.D.   On: 03/14/2021 15:48   US Venous Img Upper Uni Left  Result Date: 03/29/2021 CLINICAL DATA:  left arm  swelling/receiving treatment for lung cancer EXAM: LEFT UPPER EXTREMITY VENOUS DOPPLER ULTRASOUND TECHNIQUE: Gray-scale sonography with graded compression, as well as color Doppler and duplex ultrasound were performed to evaluate the upper extremity deep venous system from the level of the subclavian vein and including the jugular, axillary, basilic, radial, ulnar and upper cephalic vein. Spectral Doppler was utilized to evaluate flow at rest and with distal augmentation maneuvers. COMPARISON:  None. FINDINGS: Contralateral Subclavian Vein: Respiratory phasicity is normal and symmetric with the  symptomatic side. No evidence of thrombus. Normal compressibility. Internal Jugular Vein: No evidence of thrombus. Normal compressibility, respiratory phasicity and response to augmentation. Subclavian Vein: No evidence of thrombus. Normal compressibility, respiratory phasicity and response to augmentation. Axillary Vein: No evidence of thrombus. Normal compressibility, respiratory phasicity and response to augmentation. Cephalic Vein: No evidence of thrombus. Normal compressibility, respiratory phasicity and response to augmentation. Basilic Vein: No evidence of thrombus. Normal compressibility, respiratory phasicity and response to augmentation. Brachial Veins: No evidence of thrombus. Normal compressibility, respiratory phasicity and response to augmentation. Radial Veins: No evidence of thrombus. Normal compressibility, respiratory phasicity and response to augmentation. Ulnar Veins: No evidence of thrombus. Normal compressibility, respiratory phasicity and response to augmentation. Other Findings:  None visualized. IMPRESSION: No evidence of DVT within the left upper extremity. Electronically Signed   By: Albin Felling M.D.   On: 03/29/2021 14:17   MM 3D SCREEN BREAST BILATERAL  Result Date: 04/05/2021 CLINICAL DATA:  Screening. EXAM: DIGITAL SCREENING BILATERAL MAMMOGRAM WITH TOMOSYNTHESIS AND CAD TECHNIQUE:  Bilateral screening digital craniocaudal and mediolateral oblique mammograms were obtained. Bilateral screening digital breast tomosynthesis was performed. The images were evaluated with computer-aided detection. COMPARISON:  Previous exam(s). ACR Breast Density Category b: There are scattered areas of fibroglandular density. FINDINGS: In the right breast, a possible asymmetry warrants further evaluation. In the left breast, no findings suspicious for malignancy. IMPRESSION: Further evaluation is suggested for possible asymmetry in the right breast. RECOMMENDATION: Diagnostic mammogram and possibly ultrasound of the right breast. (Code:[id number]) The patient will be contacted regarding the findings, and additional imaging will be scheduled. BI-RADS CATEGORY  0: Incomplete. Need additional imaging evaluation and/or prior mammograms for comparison. Electronically Signed   By: Audie Pinto M.D.   On: 04/05/2021 16:19  ECHOCARDIOGRAM COMPLETE  Result Date: 03/15/2021    ECHOCARDIOGRAM REPORT   Patient Name:   CHATARA LUCENTE Date of Exam: 03/14/2021 Medical Rec #:  [id number]        Height:       61.0 in Accession #:    [id number]       Weight:       149.0 lb Date of Birth:  1945-10-21        BSA:          1.667 m Patient Age:    8 years         BP:           130/64 mmHg Patient Gender: F                HR:           95 bpm. Exam Location:  [facility] Procedure: 2D Echo, Color Doppler, Cardiac Doppler and Intracardiac            Opacification Agent Indications:    I48.91 Atrial fibrillation  History:        Patient has no prior history of Echocardiogram examinations.                 Risk Factors:Hypertension, Dyslipidemia and Former Smoker.  Sonographer:    Charmayne Sheer Referring Phys: [id number] BRIAN AGBOR-ETANG  Sonographer Comments: Technically difficult study due to poor echo windows. Image acquisition challenging due to patient body habitus. IMPRESSIONS  1. Left ventricular ejection fraction, by estimation, is  >55%. The left ventricle has normal function. Left ventricular endocardial border not optimally defined to evaluate regional wall motion. There is mild left ventricular hypertrophy. Left ventricular diastolic parameters are consistent with Grade I diastolic  dysfunction (impaired relaxation).  2. Right ventricular systolic function is normal. The right ventricular size is normal. Mildly increased right ventricular wall thickness.  3. The mitral valve was not well visualized. No evidence of mitral valve regurgitation. No evidence of mitral stenosis.  4. Tricuspid valve regurgitation is mild to moderate.  5. The aortic valve was not well visualized. Aortic valve regurgitation is not visualized. No aortic stenosis is present.  6. The inferior vena cava is normal in size with greater than 50% respiratory variability, suggesting right atrial pressure of 3 mmHg. FINDINGS  Left Ventricle: Left ventricular ejection fraction, by estimation, is >55%. The left ventricle has normal function. Left ventricular endocardial border not optimally defined to evaluate regional wall motion. Definity contrast agent was given IV to delineate the left ventricular endocardial Maurio Baize. The left ventricular internal cavity size was normal in size. There is mild left ventricular hypertrophy. Left ventricular diastolic parameters are consistent with Grade I diastolic dysfunction (impaired relaxation). Right Ventricle: The right ventricular size is normal. Mildly increased right ventricular wall thickness. Right ventricular systolic function is normal. Left Atrium: Left atrial size was normal in size. Right Atrium: Right atrial size was not well visualized. Pericardium: Trivial pericardial effusion is present. Mitral Valve: The mitral valve was not well visualized. Mild mitral annular calcification. No evidence of mitral valve regurgitation. No evidence of mitral valve stenosis. MV peak gradient, 2.4 mmHg. The mean mitral valve gradient is 1.0  mmHg. Tricuspid Valve: The tricuspid valve is not well visualized. Tricuspid valve regurgitation is mild to moderate. Aortic Valve: The aortic valve was not well visualized. Aortic valve regurgitation is not visualized. No aortic stenosis is present. Aortic valve mean gradient measures 3.0 mmHg. Aortic valve peak gradient measures 6.2 mmHg. Aortic valve area, by VTI measures 2.10 cm. Pulmonic Valve: The pulmonic valve was not well visualized. Pulmonic valve regurgitation is not visualized. No evidence of pulmonic stenosis. Aorta: The aortic root is normal in size and structure. Pulmonary Artery: The pulmonary artery is not well seen. Venous: The inferior vena cava is normal in size with greater than 50% respiratory variability, suggesting right atrial pressure of 3 mmHg. IAS/Shunts: The interatrial septum was not well visualized.  LEFT VENTRICLE PLAX 2D LVIDd:         3.15 cm  Diastology LVIDs:         2.20 cm  LV e' medial:    5.33 cm/s LV PW:         1.10 cm  LV E/e' medial:  8.7 LV IVS:        0.95 cm  LV e' lateral:   6.53 cm/s LVOT diam:     1.90 cm  LV E/e' lateral: 7.1 LV SV:         39 LV SV Index:   24 LVOT Area:     2.84 cm  RIGHT VENTRICLE RV Basal diam:  3.00 cm LEFT ATRIUM           Index LA diam:      2.40 cm 1.44 cm/m LA Vol (A4C): 11.0 ml 6.60 ml/m  AORTIC VALVE                   PULMONIC VALVE AV Area (Vmax):    2.10 cm    PV Vmax:       0.94 m/s AV Area (Vmean):   2.03 cm    PV Vmean:      64.600 cm/s AV Area (VTI):  2.10 cm    PV VTI:        0.145 m AV Vmax:           124.00 cm/s PV Peak grad:  3.5 mmHg AV Vmean:          81.700 cm/s PV Mean grad:  2.0 mmHg AV VTI:            0.188 m AV Peak Grad:      6.2 mmHg AV Mean Grad:      3.0 mmHg LVOT Vmax:         92.00 cm/s LVOT Vmean:        58.600 cm/s LVOT VTI:          0.139 m LVOT/AV VTI ratio: 0.74  AORTA Ao Root diam: 2.70 cm MITRAL VALVE MV Area (PHT): 6.07 cm    SHUNTS MV Area VTI:   3.66 cm    Systemic VTI:  0.14 m MV Peak grad:   2.4 mmHg    Systemic Diam: 1.90 cm MV Mean grad:  1.0 mmHg MV Vmax:       0.78 m/s MV Vmean:      45.4 cm/s MV Decel Time: 125 msec MV E velocity: 46.30 cm/s MV A velocity: 84.80 cm/s MV E/A ratio:  0.55 Christopher End MD Electronically signed by Nelva Bush MD Signature Date/Time: 03/15/2021/1:02:31 PM    Final    LONG TERM MONITOR (3-14 DAYS)  Result Date: 04/07/2021 Patch Wear Time:  14 days and 0 hours (2022-08-10T17:30:29-0400 to 2022-08-24T17:30:33-0400) Patient had a min HR of 51 bpm, max HR of 176 bpm, and avg HR of 92 bpm. Predominant underlying rhythm was Sinus Rhythm. 8 Supraventricular Tachycardia runs occurred, the run with the fastest interval lasting 9 beats with a max rate of 176 bpm, the longest lasting 18 beats with an avg rate of 125 bpm. Atrial Fibrillation occurred (22% burden), ranging from 54-169 bpm (avg of 92 bpm), the longest lasting 3 hours 54 mins with an avg rate of 86 bpm.   MM Outside Films Mammo  Result Date: 04/04/2021 This examination belongs to an outside facility and is stored here for comparison purposes only.  Contact the originating outside institution for any associated report or interpretation.   PERFORMANCE STATUS (ECOG) : 2 - Symptomatic, <50% confined to bed  Review of Systems Unless otherwise noted, a complete review of systems is negative.  Physical Exam General: NAD Pulmonary: Unlabored Extremities: no edema, no joint deformities Skin: no rashes Neurological: Weakness but otherwise nonfocal  IMPRESSION: Follow-up visit.  Patient seen in infusion.  Patient reports that she is doing about the same.  She denies any significant changes or concerns.  She continues to endorse chronic fatigue the feels like this may have been improved slightly on dexamethasone.  She also reports improved appetite on steroids.  No issues with medications.  No changes in performance status.  Patient continues to receive significant assistance from her  neighbor/friend.  Case discussed with Dr. [name].  Plan is to continue chemotherapy and will reassess treatment response with repeat CTs in the near future.  PLAN: -Continue current scope of treatment -Continue dexamethasone daily for appetite/fatigue -Continue Paxil 30 mg daily -RTC 2-3 weeks  Case and plan discussed with Dr. [name]    Patient expressed understanding and was in agreement with this plan. She also understands that She can call the clinic at any time with any questions, concerns, or complaints.     Time Total: 15 minutes  Visit consisted of counseling and education dealing with the complex and emotionally intense issues of symptom management and palliative care in the setting of serious and potentially life-threatening illness.Greater than 50%  of this time was spent counseling and coordinating care related to the above assessment and plan.  Signed by: Altha Harm, PhD, NP-C

## 2021-04-12 NOTE — Progress Notes (Signed)
Patient reports feeling weak, low appetite, nausea, and trouble sleeping. She would also like a handicap parking tag. Her left arm and hand are swollen. She has lost weight. She is concerned about fluid on the right that Dr Baruch Gouty told her about.

## 2021-04-13 ENCOUNTER — Encounter: Payer: Self-pay | Admitting: Oncology

## 2021-04-13 ENCOUNTER — Ambulatory Visit
Admission: RE | Admit: 2021-04-13 | Discharge: 2021-04-13 | Disposition: A | Discharge status: Home / Self Care | Payer: Medicare Other | Source: Ambulatory Visit | Attending: Radiation Oncology | Admitting: Radiation Oncology

## 2021-04-13 DIAGNOSIS — R918 Other nonspecific abnormal finding of lung field: Secondary | ICD-10-CM | POA: Diagnosis not present

## 2021-04-14 ENCOUNTER — Ambulatory Visit
Admission: RE | Admit: 2021-04-14 | Discharge: 2021-04-14 | Disposition: A | Discharge status: Home / Self Care | Payer: Medicare Other | Source: Ambulatory Visit | Attending: Radiation Oncology | Admitting: Radiation Oncology

## 2021-04-14 ENCOUNTER — Telehealth: Payer: Self-pay | Admitting: Student

## 2021-04-14 DIAGNOSIS — R918 Other nonspecific abnormal finding of lung field: Secondary | ICD-10-CM | POA: Diagnosis not present

## 2021-04-14 NOTE — Telephone Encounter (Signed)
Spoke with patient's friend/HCPOA, Ginny Ward, regarding the Palliative referral and she was in agreement with scheduling visit.  I have scheduled an In-home Consult for 04/17/21 @ 2:30 PM

## 2021-04-15 NOTE — Progress Notes (Signed)
[place]  [phone] [phone]  ID: Brittany Wheeler OB: 1945-10-25  MR#: [id number]  SJG#:[id number]  Patient Care Team: [name], MD as PCP - General (Family Medicine) [name], MD as PCP - Cardiology (Cardiology) [name], RN as Oncology Nurse Navigator  CHIEF COMPLAINT:Stage IIIb lung cancer.  INTERVAL HISTORY: Patient returns to clinic today for further evaluation and consideration of cycle 6 of weekly carboplatin and Taxol.  Her performance status continues to decline.  She continues to have chronic weakness and fatigue and a poor appetite.  She continues to have shortness of breath and cough.  Her left arm swelling is improved.  She denies any pain.  She has no neurologic complaints.  She denies any recent fevers.  She denies any chest pain or hemoptysis.  She has no nausea, vomiting, constipation, or diarrhea.  She has no urinary complaints.  Patient offers no further specific complaints today.  REVIEW OF SYSTEMS:   Review of Systems  Constitutional:  Positive for malaise/fatigue. Negative for fever and weight loss.  Respiratory:  Positive for cough and shortness of breath. Negative for hemoptysis.   Cardiovascular: Negative.  Negative for chest pain and leg swelling.  Gastrointestinal: Negative.  Negative for abdominal pain and nausea.  Genitourinary: Negative.  Negative for dysuria.  Musculoskeletal: Negative.  Negative for back pain.  Skin: Negative.  Negative for itching and rash.  Neurological:  Positive for weakness. Negative for dizziness, focal weakness and headaches.  Psychiatric/Behavioral: Negative.  The patient is not nervous/anxious.    As per HPI. Otherwise, a complete review of systems is negative.  PAST MEDICAL HISTORY: Past Medical History:  Diagnosis Date   Anxiety    Arthritis    Asthma    Cancer ([place])    benign mass removed   Complication of anesthesia    Dyspnea    Dysrhythmia     Hyperlipidemia    Hypertension    Hypothyroidism    PONV (postoperative nausea and vomiting)    Pre-diabetes    Thyroid disease     PAST SURGICAL HISTORY: Past Surgical History:  Procedure Laterality Date   COLONOSCOPY     FLEXIBLE BRONCHOSCOPY Bilateral 03/06/2021   Procedure: FLEXIBLE BRONCHOSCOPY;  Surgeon: [name], MD;  Location: [facility];  Service: Pulmonary;  Laterality: Bilateral;   PORTA CATH INSERTION N/A 03/13/2021   Procedure: PORTA CATH INSERTION;  Surgeon: [name], MD;  Location: [facility] [facility];  Service: Cardiovascular;  Laterality: N/A;   stent placed in kidney     THYROIDECTOMY      FAMILY HISTORY: Family History  Problem Relation Age of Onset   Mesothelioma Father    Rectal cancer Sister     ADVANCED DIRECTIVES (Y/N):  N  HEALTH MAINTENANCE: Social History   Tobacco Use   Smoking status: Former    Types: Cigarettes    Quit date: 2012    Years since quitting: 10.7   Smokeless tobacco: Never  Vaping Use   Vaping Use: Never used  Substance Use Topics   Alcohol use: Not Currently   Drug use: Never     Colonoscopy:  PAP:  Bone density:  Lipid panel:  Allergies  Allergen Reactions   Atorvastatin Other (See Comments)    Joint pain   Latex    Lovastatin Other (See Comments)    Arthritis pain in hands   Penicillins     Current Outpatient Medications  Medication Sig Dispense Refill   ALPRAZolam Duanne Moron)  0.25 MG tablet Take 1 tablet (0.25 mg total) by mouth 2 (two) times daily as needed for anxiety. 60 tablet 0   apixaban (ELIQUIS) 5 MG TABS tablet Take 1 tablet (5 mg total) by mouth 2 (two) times daily. 60 tablet 5   Budeson-Glycopyrrol-Formoterol (BREZTRI AEROSPHERE) 160-9-4.8 MCG/ACT AERO Inhale 1 spray into the lungs 2 (two) times daily. 10.7 g 2   dexamethasone (DECADRON) 4 MG tablet Take 1 tablet (4 mg total) by mouth daily. 30 tablet 0   diltiazem (CARDIZEM CD) 240 MG 24 hr capsule Take 1 capsule (240 mg total) by mouth  daily. To control your heart rate from Afib. 30 capsule 2   fluticasone (FLONASE) 50 MCG/ACT nasal spray Place 1 spray into both nostrils in the morning and at bedtime. 15 mL 1   guaiFENesin-dextromethorphan (ROBITUSSIN DM) 100-10 MG/5ML syrup Take 10 mLs by mouth every 6 (six) hours as needed for cough. 118 mL 0   hydrochlorothiazide (HYDRODIURIL) 12.5 MG tablet Take 12.5 mg by mouth daily.     ipratropium (ATROVENT) 0.06 % nasal spray Place 2 sprays into both nostrils 3 (three) times daily.     ipratropium-albuterol (DUONEB) 0.5-2.5 (3) MG/3ML SOLN Take 3 mLs by nebulization every 6 (six) hours as needed. 120 mL 2   levocetirizine (XYZAL) 5 MG tablet Take 5 mg by mouth daily as needed.     levothyroxine (SYNTHROID) 112 MCG tablet Take 112 mcg by mouth daily.     lidocaine-prilocaine (EMLA) cream Apply 1 application topically as needed. 30 g 0   Multiple Vitamin (MULTI-VITAMIN) tablet Take 1 tablet by mouth daily.     ondansetron (ZOFRAN) 8 MG tablet Take 1 tablet (8 mg total) by mouth 2 (two) times daily as needed for refractory nausea / vomiting. 60 tablet 1   PARoxetine (PAXIL) 20 MG tablet Take 30 mg by mouth daily.     potassium chloride SA (KLOR-CON) 20 MEQ tablet Take 1 tablet (20 mEq total) by mouth daily. 30 tablet 0   prochlorperazine (COMPAZINE) 10 MG tablet TAKE 1 TABLET BY MOUTH EVERY 6 HOURS AS NEEDED FOR NAUSEA OR VOMITING 60 tablet 1   calcium-vitamin D (OSCAL WITH D) 500-200 MG-UNIT tablet Take 1 tablet by mouth 2 (two) times daily. 60 tablet 2   ezetimibe (ZETIA) 10 MG tablet TAKE 1 TABLET(10 MG) BY MOUTH EVERY DAY (Patient not taking: No sig reported)     fluconazole (DIFLUCAN) 150 MG tablet Take 1 tablet (150 mg total) by mouth daily. (Patient not taking: No sig reported) 7 tablet 0   No current facility-administered medications for this visit.   Facility-Administered Medications Ordered in Other Visits  Medication Dose Route Frequency Provider Last Rate Last Admin   sodium  chloride flush (NS) 0.9 % injection 10 mL  10 mL Intravenous PRN [name], MD   10 mL at 04/12/21 0851    OBJECTIVE: Vitals:   04/19/21 0925  BP: 112/80  Pulse: 100  Resp: 18  Temp: 98.3 F (36.8 C)  SpO2: 100%     Body mass index is 26.21 kg/m.    ECOG FS:2 - Symptomatic, <50% confined to bed  General: Well-developed, well-nourished, no acute distress.  Sitting in a wheelchair. Eyes: Pink conjunctiva, anicteric sclera. HEENT: Normocephalic, moist mucous membranes. Lungs: No audible wheezing or coughing. Heart: Regular rate and rhythm. Abdomen: Soft, nontender, no obvious distention. Musculoskeletal: No edema, cyanosis, or clubbing. Neuro: Alert, answering all questions appropriately. Cranial nerves grossly intact. Skin: No rashes or  petechiae noted. Psych: Normal affect.    LAB RESULTS:  Lab Results  Component Value Date   NA 130 (L) 04/19/2021   K 3.7 04/19/2021   CL 94 (L) 04/19/2021   CO2 26 04/19/2021   GLUCOSE 146 (H) 04/19/2021   BUN 22 04/19/2021   CREATININE 0.67 04/19/2021   CALCIUM 6.5 (L) 04/19/2021   PROT 6.1 (L) 04/19/2021   ALBUMIN 3.0 (L) 04/19/2021   AST 23 04/19/2021   ALT 126 (H) 04/19/2021   ALKPHOS 90 04/19/2021   BILITOT 0.6 04/19/2021   GFRNONAA >60 04/19/2021    Lab Results  Component Value Date   WBC 2.1 (L) 04/19/2021   NEUTROABS 1.6 (L) 04/19/2021   HGB 9.7 (L) 04/19/2021   HCT 28.9 (L) 04/19/2021   MCV 88.1 04/19/2021   PLT 173 04/19/2021     STUDIES: US Venous Img Upper Uni Left  Result Date: 03/29/2021 CLINICAL DATA:  left arm swelling/receiving treatment for lung cancer EXAM: LEFT UPPER EXTREMITY VENOUS DOPPLER ULTRASOUND TECHNIQUE: Gray-scale sonography with graded compression, as well as color Doppler and duplex ultrasound were performed to evaluate the upper extremity deep venous system from the level of the subclavian vein and including the jugular, axillary, basilic, radial, ulnar and upper cephalic vein.  Spectral Doppler was utilized to evaluate flow at rest and with distal augmentation maneuvers. COMPARISON:  None. FINDINGS: Contralateral Subclavian Vein: Respiratory phasicity is normal and symmetric with the symptomatic side. No evidence of thrombus. Normal compressibility. Internal Jugular Vein: No evidence of thrombus. Normal compressibility, respiratory phasicity and response to augmentation. Subclavian Vein: No evidence of thrombus. Normal compressibility, respiratory phasicity and response to augmentation. Axillary Vein: No evidence of thrombus. Normal compressibility, respiratory phasicity and response to augmentation. Cephalic Vein: No evidence of thrombus. Normal compressibility, respiratory phasicity and response to augmentation. Basilic Vein: No evidence of thrombus. Normal compressibility, respiratory phasicity and response to augmentation. Brachial Veins: No evidence of thrombus. Normal compressibility, respiratory phasicity and response to augmentation. Radial Veins: No evidence of thrombus. Normal compressibility, respiratory phasicity and response to augmentation. Ulnar Veins: No evidence of thrombus. Normal compressibility, respiratory phasicity and response to augmentation. Other Findings:  None visualized. IMPRESSION: No evidence of DVT within the left upper extremity. Electronically Signed   By: Albin Felling M.D.   On: 03/29/2021 14:17   MM 3D SCREEN BREAST BILATERAL  Result Date: 04/05/2021 CLINICAL DATA:  Screening. EXAM: DIGITAL SCREENING BILATERAL MAMMOGRAM WITH TOMOSYNTHESIS AND CAD TECHNIQUE: Bilateral screening digital craniocaudal and mediolateral oblique mammograms were obtained. Bilateral screening digital breast tomosynthesis was performed. The images were evaluated with computer-aided detection. COMPARISON:  Previous exam(s). ACR Breast Density Category b: There are scattered areas of fibroglandular density. FINDINGS: In the right breast, a possible asymmetry warrants further  evaluation. In the left breast, no findings suspicious for malignancy. IMPRESSION: Further evaluation is suggested for possible asymmetry in the right breast. RECOMMENDATION: Diagnostic mammogram and possibly ultrasound of the right breast. (Code:[id number]-R-67M) The patient will be contacted regarding the findings, and additional imaging will be scheduled. BI-RADS CATEGORY  0: Incomplete. Need additional imaging evaluation and/or prior mammograms for comparison. Electronically Signed   By: Audie Pinto M.D.   On: 04/05/2021 16:19  LONG TERM MONITOR (3-14 DAYS)  Result Date: 04/07/2021 Patch Wear Time:  14 days and 0 hours (2022-08-10T17:30:29-0400 to 2022-08-24T17:30:33-0400) Patient had a min HR of 51 bpm, max HR of 176 bpm, and avg HR of 92 bpm. Predominant underlying rhythm was Sinus Rhythm. 8 Supraventricular Tachycardia runs occurred,  the run with the fastest interval lasting 9 beats with a max rate of 176 bpm, the longest lasting 18 beats with an avg rate of 125 bpm. Atrial Fibrillation occurred (22% burden), ranging from 54-169 bpm (avg of 92 bpm), the longest lasting 3 hours 54 mins with an avg rate of 86 bpm.   MM Outside Films Mammo  Result Date: 04/04/2021 This examination belongs to an outside facility and is stored here for comparison purposes only.  Contact the originating outside institution for any associated report or interpretation.   ASSESSMENT: Stage IIIb lung cancer.  PLAN:    Stage IIIb lung cancer: CT scan results from February 27, 2021 reviewed independently and reported as above with bulky 7 cm right middle lobe mass invading right hilum with bulky mediastinal lymphadenopathy highly suspicious for underlying bronchogenic malignancy.  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  MRI of the brain on February 28, 2021 reviewed independently with no obvious metastatic disease.  PET scan on March 14, 2021 confirmed staging with no obvious evidence of metastatic  disease.  Patient has now had port placement.  Proceed with cycle 6 of weekly carboplatinum and Taxol today.  Continue daily XRT completing treatment on April 27, 2021.  Patient states she plans to move to [place] to live with a cousin at the conclusion of her XRT.  Recommended maintenance durvalumab after completing XRT.  We also briefly discussed hospice, but patient is not interested at this time.  Return to clinic in 1 week for further evaluation and her seventh and final treatment.   Atrial fibrillation: Continue Cardizem and Eliquis. Appreciate cardiology input. Anxiety: Chronic and unchanged.  Continue Xanax as prescribed. Weakness and fatigue: Continue home health.  Patient is now moving to [place] as above.  Hospice was considered, but patient wishes to continue treatment. Left arm swelling: Improved.  Ultrasound negative for DVT.  Monitor. Poor appetite: Improved.  Continue 69m dexamethasone daily. Hypokalemia: Resolved.  Continue oral potassium supplementation. Leukopenia: Patient's white blood cell count and neutrophil count have decreased, proceed with treatment as planned. Anemia: Patient's hemoglobin has trended down to 9.7, monitor. Hyponatremia: Chronic and unchanged.  Monitor.   Patient expressed understanding and was in agreement with this plan. She also understands that She can call clinic at any time with any questions, concerns, or complaints.   Cancer Staging Primary cancer of right middle lobe of lung ([facility] Staging form: Lung, AJCC 8th Edition - Clinical stage from 03/07/2021: Stage IIIB (cT4, cN2, cM0) - Signed by FLloyd Huger MD on 03/07/2021 Stage prefix: Initial diagnosis  TLloyd Huger MD   04/20/2021 10:55 AM

## 2021-04-16 ENCOUNTER — Encounter: Payer: Self-pay | Admitting: Oncology

## 2021-04-17 ENCOUNTER — Other Ambulatory Visit: Payer: Medicare Other | Admitting: Student

## 2021-04-17 ENCOUNTER — Other Ambulatory Visit: Payer: Self-pay

## 2021-04-17 ENCOUNTER — Ambulatory Visit
Admission: RE | Admit: 2021-04-17 | Discharge: 2021-04-17 | Disposition: A | Discharge status: Home / Self Care | Payer: Medicare Other | Source: Ambulatory Visit | Attending: Radiation Oncology | Admitting: Radiation Oncology

## 2021-04-17 DIAGNOSIS — C342 Malignant neoplasm of middle lobe, bronchus or lung: Secondary | ICD-10-CM

## 2021-04-17 DIAGNOSIS — R410 Disorientation, unspecified: Secondary | ICD-10-CM

## 2021-04-17 DIAGNOSIS — K5900 Constipation, unspecified: Secondary | ICD-10-CM

## 2021-04-17 DIAGNOSIS — Z515 Encounter for palliative care: Secondary | ICD-10-CM

## 2021-04-17 DIAGNOSIS — R630 Anorexia: Secondary | ICD-10-CM

## 2021-04-17 DIAGNOSIS — R918 Other nonspecific abnormal finding of lung field: Secondary | ICD-10-CM | POA: Diagnosis not present

## 2021-04-17 DIAGNOSIS — F32A Depression, unspecified: Secondary | ICD-10-CM

## 2021-04-17 DIAGNOSIS — F419 Anxiety disorder, unspecified: Secondary | ICD-10-CM

## 2021-04-17 DIAGNOSIS — R5383 Other fatigue: Secondary | ICD-10-CM

## 2021-04-17 NOTE — Progress Notes (Signed)
[facility] Consult Note Telephone: [phone]  Fax: [phone]   Date of encounter: 04/17/21 2:50 PM PATIENT NAME: Brittany Wheeler [address] [place] [address]   [phone] (home)  DOB: 1945/11/27 MRN: [id number] PRIMARY CARE PROVIDER:    [name], MD,  Brittany Wheeler [id number] [phone]  REFERRING PROVIDER:   Billey Chang, NP  RESPONSIBLE PARTY:    Contact Information     Name Relation Home Work Mobile   [place]   [phone]        I met face to face with patient and Brittany Wheeler and friend Brittany Wheeler in the home. Palliative Care was asked to follow this patient by consultation request of  Brittany Chang, NP to address advance care planning and complex medical decision making. This is the initial visit.                                     ASSESSMENT AND PLAN / RECOMMENDATIONS:   Advance Care Planning/Goals of Care: Goals include to maximize quality of life and symptom management. Patient/health care surrogate gave his/her permission to discuss.Our advance care planning conversation included a discussion about:    The value and importance of advance care planning  Experiences with loved ones who have been seriously ill or have died  Exploration of personal, cultural or spiritual beliefs that might influence medical decisions  Exploration of goals of care in the event of a sudden injury or illness  Identification and preparation of a healthcare agent  Code status reviewed; DNR.  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 She states she would like to stop treatments due to how bad she is feeling. She would like to discuss with Dr. Grayland Wheeler and Brittany Dove, NP prior to stopping. Scheduled radiation treatment tomorrow. NP notified Brittany Wheeler to make him aware, as  patient has had increased confusion over the weekend.   Confusion, altered mental status-Sodium was 131 on 04/12/21. Recommend CMP, CBC. Patient's Paxil recently increased to 23m daily; could be contributing to her hyponatremia. Recommend stopping Paxil.  Anxiety/depression-Recommend stopping paxil and start mirtazapine 7.5 mg given her depression and poor appetite. Continue alprazolam PRN.   Decline in Appetite-Continue foods patient enjoys; Boost plus BID. Continue dexamethasone as directed.  Fatigue-continue dexamethasone as directed.   Constipation-Start senna one tablet at bedtime. Adequate fluids encouraged.     Follow up Palliative Care Visit: Palliative care will continue to follow for complex medical decision making, advance care planning, and clarification of goals. Return in 4 weeks or prn.  I spent 60 minutes providing this consultation. More than 50% of the time in this consultation was spent in counseling and care coordination.   PPS: 50%  HOSPICE ELIGIBILITY/DIAGNOSIS: TBD  Chief Complaint: Palliative Medicine follow up visit.   HISTORY OF PRESENT ILLNESS:  Brittany DOUTTis a 75y.o. year old female  with stage IIIb squamous cell lung cancer, anxiety, arthritis, asthma, hypertension, hyperlipidemia, hypothyroidism. Patient is currently receiving chemo/radiation.   Patient resides at home. She expresses worsening fatigue, she was started on dexamethasone almost two weeks ago; not much improvement reported per patient and Brittany Wheeler reports patient having increased confusion over the weekend. She called EMS, but was not transported to ED due to VS being stable.  She reports feeling "awful" states "just take me out." She states she does not want to continue current treatment plan of chemoradiation. She denies pain; she endorses shortness of breath with exertion. She wears oxygen when sleeping. She is using a borrowed [employer] oxygen tank. Poor appetite endorses; she is  drinking 1-2 Boost plus a day. She endorses constipation; during visit today she reports small amount of bright red blood with her stool. Patient  has experienced swelling to left hand/arm; this has since improved. No LE edema reported. She does endorse occasional anxiety; takes alprazolam with relief. Rollator used for ambulation; no falls reported. Brittany Wheeler states patient is talking about moving to [place] around October 7th.    History obtained from review of EMR, discussion with primary team, and interview with family, facility staff/caregiver and/or Brittany Wheeler.  I reviewed available labs, medications, imaging, studies and related documents from the EMR.  Records reviewed and summarized above.   ROS  General: NAD EYES: denies vision changes ENMT: denies dysphagia Cardiovascular: denies chest pain, denies DOE Pulmonary: denies cough, SOB with exertion Abdomen: denies constipation, endorses continence of bowel GU: denies dysuria MSK: weakness,  no falls reported Skin: denies rashes or wounds Neurological: denies pain, denies insomnia Psych: anxiety Heme/lymph/immuno: denies bruises, abnormal bleeding  Physical Exam: Weight: 138 pounds Pulse 72, resp 16, b/p 124/68, sats 97% on room air Constitutional: NAD General: frail appearing EYES: anicteric sclera, lids intact, no discharge  ENMT: intact hearing, oral mucous membranes moist CV: S1S2, RRR, no LE edema Pulmonary: LCTA, no increased work of breathing, no cough, room air Abdomen: normo-active BS + 4 quadrants, soft and non tender GU: deferred MSK: moves all extremities, ambulatory Skin: cool and dry, no rashes or wounds on visible skin Neuro: generalized weakness, A & O x 3 Psych: non-anxious affect Hem/lymph/immuno: no widespread bruising CURRENT PROBLEM LIST:  Patient Active Problem List   Diagnosis Date Noted   Primary cancer of right middle lobe of lung ([place]) 03/07/2021   Acute respiratory failure ([place]) 02/27/2021    Lung mass 02/27/2021   Hypertension    Thyroid disease    Depression    PAST MEDICAL HISTORY:  Active Ambulatory Problems    Diagnosis Date Noted   Acute respiratory failure ([place]) 02/27/2021   Lung mass 02/27/2021   Hypertension    Thyroid disease    Depression    Primary cancer of right middle lobe of lung ([place]) 03/07/2021   Resolved Ambulatory Problems    Diagnosis Date Noted   No Resolved Ambulatory Problems   Past Medical History:  Diagnosis Date   Anxiety    Arthritis    Asthma    Cancer ([place])    Complication of anesthesia    Dyspnea    Dysrhythmia    Hyperlipidemia    Hypothyroidism    PONV (postoperative nausea and vomiting)    Pre-diabetes    SOCIAL HX:  Social History   Tobacco Use   Smoking status: Former    Types: Cigarettes    Quit date: 2012    Years since quitting: 10.7   Smokeless tobacco: Never  Substance Use Topics   Alcohol use: Not Currently   FAMILY HX:  Family History  Problem Relation Age of Onset   Mesothelioma Father    Rectal cancer Sister       ALLERGIES:  Allergies  Allergen Reactions   Atorvastatin Other (See Comments)    Joint pain   Latex    Lovastatin Other (See Comments)  Arthritis pain in hands   Penicillins      PERTINENT MEDICATIONS:  Outpatient Encounter Medications as of 04/17/2021  Medication Sig   ALPRAZolam (XANAX) 0.25 MG tablet Take 1 tablet (0.25 mg total) by mouth 2 (two) times daily as needed for anxiety.   apixaban (ELIQUIS) 5 MG TABS tablet Take 1 tablet (5 mg total) by mouth 2 (two) times daily.   Budeson-Glycopyrrol-Formoterol (BREZTRI AEROSPHERE) 160-9-4.8 MCG/ACT AERO Inhale 1 spray into the lungs 2 (two) times daily.   dexamethasone (DECADRON) 4 MG tablet Take 1 tablet (4 mg total) by mouth daily.   diltiazem (CARDIZEM CD) 240 MG 24 hr capsule Take 1 capsule (240 mg total) by mouth daily. To control your heart rate from Afib.   ezetimibe (ZETIA) 10 MG tablet TAKE 1 TABLET(10 MG) BY MOUTH  EVERY DAY (Patient not taking: No sig reported)   fluconazole (DIFLUCAN) 150 MG tablet Take 1 tablet (150 mg total) by mouth daily. (Patient not taking: No sig reported)   fluticasone (FLONASE) 50 MCG/ACT nasal spray Place 1 spray into both nostrils in the morning and at bedtime.   guaiFENesin-dextromethorphan (ROBITUSSIN DM) 100-10 MG/5ML syrup Take 10 mLs by mouth every 6 (six) hours as needed for cough.   hydrochlorothiazide (HYDRODIURIL) 12.5 MG tablet Take 12.5 mg by mouth daily.   ipratropium (ATROVENT) 0.06 % nasal spray Place 2 sprays into both nostrils 3 (three) times daily.   ipratropium-albuterol (DUONEB) 0.5-2.5 (3) MG/3ML SOLN Take 3 mLs by nebulization every 6 (six) hours as needed.   levocetirizine (XYZAL) 5 MG tablet Take 5 mg by mouth daily as needed.   levothyroxine (SYNTHROID) 112 MCG tablet Take 112 mcg by mouth daily.   lidocaine-prilocaine (EMLA) cream Apply 1 application topically as needed.   Multiple Vitamin (MULTI-VITAMIN) tablet Take 1 tablet by mouth daily.   ondansetron (ZOFRAN) 8 MG tablet Take 1 tablet (8 mg total) by mouth 2 (two) times daily as needed for refractory nausea / vomiting.   PARoxetine (PAXIL) 20 MG tablet Take 30 mg by mouth daily.   potassium chloride SA (KLOR-CON) 20 MEQ tablet Take 1 tablet (20 mEq total) by mouth daily.   prochlorperazine (COMPAZINE) 10 MG tablet TAKE 1 TABLET BY MOUTH EVERY 6 HOURS AS NEEDED FOR NAUSEA OR VOMITING   Facility-Administered Encounter Medications as of 04/17/2021  Medication   sodium chloride flush (NS) 0.9 % injection 10 mL   Thank you for the opportunity to participate in the care of Ms. Rowlette.  The palliative care team will continue to follow. Please call our office at [phone] if we can be of additional assistance.   Ezekiel Slocumb, NP   COVID-19 PATIENT SCREENING TOOL Asked and negative response unless otherwise noted:  Have you had symptoms of covid, tested positive or been in contact with someone  with symptoms/positive test in the past 5-10 days? No

## 2021-04-18 ENCOUNTER — Ambulatory Visit
Admission: RE | Admit: 2021-04-18 | Discharge: 2021-04-18 | Disposition: A | Discharge status: Home / Self Care | Payer: Medicare Other | Source: Ambulatory Visit | Attending: Radiation Oncology | Admitting: Radiation Oncology

## 2021-04-18 DIAGNOSIS — R918 Other nonspecific abnormal finding of lung field: Secondary | ICD-10-CM | POA: Diagnosis not present

## 2021-04-19 ENCOUNTER — Inpatient Hospital Stay: Payer: Medicare Other

## 2021-04-19 ENCOUNTER — Inpatient Hospital Stay (HOSPITAL_BASED_OUTPATIENT_CLINIC_OR_DEPARTMENT_OTHER): Payer: Medicare Other | Admitting: Hospice and Palliative Medicine

## 2021-04-19 ENCOUNTER — Inpatient Hospital Stay (HOSPITAL_BASED_OUTPATIENT_CLINIC_OR_DEPARTMENT_OTHER): Payer: Medicare Other | Admitting: Oncology

## 2021-04-19 ENCOUNTER — Other Ambulatory Visit: Payer: Self-pay

## 2021-04-19 ENCOUNTER — Ambulatory Visit
Admission: RE | Admit: 2021-04-19 | Discharge: 2021-04-19 | Disposition: A | Discharge status: Home / Self Care | Payer: Medicare Other | Source: Ambulatory Visit | Attending: Radiation Oncology | Admitting: Radiation Oncology

## 2021-04-19 VITALS — BP 121/64 | HR 88 | Resp 20

## 2021-04-19 VITALS — BP 112/80 | HR 100 | Temp 98.3°F | Resp 18 | Wt 138.7 lb

## 2021-04-19 DIAGNOSIS — C342 Malignant neoplasm of middle lobe, bronchus or lung: Secondary | ICD-10-CM | POA: Diagnosis not present

## 2021-04-19 DIAGNOSIS — R918 Other nonspecific abnormal finding of lung field: Secondary | ICD-10-CM | POA: Diagnosis not present

## 2021-04-19 DIAGNOSIS — Z515 Encounter for palliative care: Secondary | ICD-10-CM | POA: Diagnosis not present

## 2021-04-19 LAB — CBC WITH DIFFERENTIAL/PLATELET
Abs Immature Granulocytes: 0.1 10*3/uL — ABNORMAL HIGH (ref 0.00–0.07)
Basophils Absolute: 0 10*3/uL (ref 0.0–0.1)
Basophils Relative: 1 %
Eosinophils Absolute: 0 10*3/uL (ref 0.0–0.5)
Eosinophils Relative: 0 %
HCT: 28.9 % — ABNORMAL LOW (ref 36.0–46.0)
Hemoglobin: 9.7 g/dL — ABNORMAL LOW (ref 12.0–15.0)
Immature Granulocytes: 5 %
Lymphocytes Relative: 5 %
Lymphs Abs: 0.1 10*3/uL — ABNORMAL LOW (ref 0.7–4.0)
MCH: 29.6 pg (ref 26.0–34.0)
MCHC: 33.6 g/dL (ref 30.0–36.0)
MCV: 88.1 fL (ref 80.0–100.0)
Monocytes Absolute: 0.2 10*3/uL (ref 0.1–1.0)
Monocytes Relative: 11 %
Neutro Abs: 1.6 10*3/uL — ABNORMAL LOW (ref 1.7–7.7)
Neutrophils Relative %: 78 %
Platelets: 173 10*3/uL (ref 150–400)
RBC: 3.28 MIL/uL — ABNORMAL LOW (ref 3.87–5.11)
RDW: 14.7 % (ref 11.5–15.5)
Smear Review: NORMAL
WBC: 2.1 10*3/uL — ABNORMAL LOW (ref 4.0–10.5)
nRBC: 0 % (ref 0.0–0.2)

## 2021-04-19 LAB — COMPREHENSIVE METABOLIC PANEL
ALT: 126 U/L — ABNORMAL HIGH (ref 0–44)
AST: 23 U/L (ref 15–41)
Albumin: 3 g/dL — ABNORMAL LOW (ref 3.5–5.0)
Alkaline Phosphatase: 90 U/L (ref 38–126)
Anion gap: 10 (ref 5–15)
BUN: 22 mg/dL (ref 8–23)
CO2: 26 mmol/L (ref 22–32)
Calcium: 6.5 mg/dL — ABNORMAL LOW (ref 8.9–10.3)
Chloride: 94 mmol/L — ABNORMAL LOW (ref 98–111)
Creatinine, Ser: 0.67 mg/dL (ref 0.44–1.00)
GFR, Estimated: >60 mL/min (ref >60)
Glucose, Bld: 146 mg/dL — ABNORMAL HIGH (ref 70–99)
Potassium: 3.7 mmol/L (ref 3.5–5.1)
Sodium: 130 mmol/L — ABNORMAL LOW (ref 135–145)
Total Bilirubin: 0.6 mg/dL (ref 0.3–1.2)
Total Protein: 6.1 g/dL — ABNORMAL LOW (ref 6.5–8.1)

## 2021-04-19 MED ORDER — HEPARIN SOD (PORK) LOCK FLUSH 100 UNIT/ML IV SOLN
INTRAVENOUS | Status: AC
  Administered 2021-04-19: 500 [IU]
  Filled 2021-04-19: qty 5

## 2021-04-19 MED ORDER — FAMOTIDINE 20 MG IN NS 100 ML IVPB
20.0000 mg | Freq: Once | INTRAVENOUS | Status: AC
  Administered 2021-04-19: 20 mg via INTRAVENOUS
  Filled 2021-04-19: qty 20

## 2021-04-19 MED ORDER — DIPHENHYDRAMINE HCL 50 MG/ML IJ SOLN
25.0000 mg | Freq: Once | INTRAMUSCULAR | Status: AC
  Administered 2021-04-19: 25 mg via INTRAVENOUS
  Filled 2021-04-19: qty 1

## 2021-04-19 MED ORDER — SODIUM CHLORIDE 0.9 % IV SOLN
Freq: Once | INTRAVENOUS | Status: AC
  Filled 2021-04-19: qty 250

## 2021-04-19 MED ORDER — SODIUM CHLORIDE 0.9% FLUSH
10.0000 mL | Freq: Once | INTRAVENOUS | Status: AC
  Administered 2021-04-19: 10 mL via INTRAVENOUS
  Filled 2021-04-19: qty 10

## 2021-04-19 MED ORDER — SODIUM CHLORIDE 0.9 % IV SOLN
159.2000 mg | Freq: Once | INTRAVENOUS | Status: AC
  Administered 2021-04-19: 160 mg via INTRAVENOUS
  Filled 2021-04-19: qty 16

## 2021-04-19 MED ORDER — SODIUM CHLORIDE 0.9 % IV SOLN
10.0000 mg | Freq: Once | INTRAVENOUS | Status: AC
  Administered 2021-04-19: 10 mg via INTRAVENOUS
  Filled 2021-04-19: qty 10

## 2021-04-19 MED ORDER — SODIUM CHLORIDE 0.9 % IV SOLN
45.0000 mg/m2 | Freq: Once | INTRAVENOUS | Status: AC
  Administered 2021-04-19: 78 mg via INTRAVENOUS
  Filled 2021-04-19: qty 13

## 2021-04-19 MED ORDER — HEPARIN SOD (PORK) LOCK FLUSH 100 UNIT/ML IV SOLN
500.0000 [IU] | Freq: Once | INTRAVENOUS | Status: AC | PRN
Start: 2021-04-19 — End: 2021-04-19
  Filled 2021-04-19: qty 5

## 2021-04-19 MED ORDER — PALONOSETRON HCL INJECTION 0.25 MG/5ML
0.2500 mg | Freq: Once | INTRAVENOUS | Status: AC
  Administered 2021-04-19: 0.25 mg via INTRAVENOUS
  Filled 2021-04-19: qty 5

## 2021-04-19 NOTE — Patient Instructions (Signed)
[facility]  Discharge Instructions: Thank you for choosing [facility] to provide your oncology and hematology care.  If you have a lab appointment with the Cancer Center, please go directly to the Cancer Center and check in at the registration area.  Wear comfortable clothing and clothing appropriate for easy access to any Portacath or PICC line.   We strive to give you quality time with your provider. You may need to reschedule your appointment if you arrive late (15 or more minutes).  Arriving late affects you and other patients whose appointments are after yours.  Also, if you miss three or more appointments without notifying the office, you may be dismissed from the clinic at the provider's discretion.      For prescription refill requests, have your pharmacy contact our office and allow 72 hours for refills to be completed.    Today you received the following chemotherapy and/or immunotherapy agents Taxol & Carboplatin      To help prevent nausea and vomiting after your treatment, we encourage you to take your nausea medication as directed.  BELOW ARE SYMPTOMS THAT SHOULD BE REPORTED IMMEDIATELY: *FEVER GREATER THAN 100.4 F (38 C) OR HIGHER *CHILLS OR SWEATING *NAUSEA AND VOMITING THAT IS NOT CONTROLLED WITH YOUR NAUSEA MEDICATION *UNUSUAL SHORTNESS OF BREATH *UNUSUAL BRUISING OR BLEEDING *URINARY PROBLEMS (pain or burning when urinating, or frequent urination) *BOWEL PROBLEMS (unusual diarrhea, constipation, pain near the anus) TENDERNESS IN MOUTH AND THROAT WITH OR WITHOUT PRESENCE OF ULCERS (sore throat, sores in mouth, or a toothache) UNUSUAL RASH, SWELLING OR PAIN  UNUSUAL VAGINAL DISCHARGE OR ITCHING   Items with * indicate a potential emergency and should be followed up as soon as possible or go to the Emergency Department if any problems should occur.  Please show the CHEMOTHERAPY ALERT CARD or IMMUNOTHERAPY ALERT CARD at  check-in to the Emergency Department and triage nurse.  Should you have questions after your visit or need to cancel or reschedule your appointment, please contact [facility]  [phone] and follow the prompts.  Office hours are 8:00 a.m. to 4:30 p.m. Monday - Friday. Please note that voicemails left after 4:00 p.m. may not be returned until the following business day.  We are closed weekends and major holidays. You have access to a nurse at all times for urgent questions. Please call the main number to the clinic [phone] and follow the prompts.  For any non-urgent questions, you may also contact your provider using MyChart. We now offer e-Visits for anyone 18 and older to request care online for non-urgent symptoms. For details visit [email or web address].   Also download the MyChart app! Go to the app store, search "MyChart", open the app, select , and log in with your MyChart username and password.  Due to Covid, a mask is required upon entering the hospital/clinic. If you do not have a mask, one will be given to you upon arrival. For doctor visits, patients may have 1 support person aged 18 or older with them. For treatment visits, patients cannot have anyone with them due to current Covid guidelines and our immunocompromised population.  

## 2021-04-19 NOTE — Progress Notes (Signed)
[facility]  Telephone:([phone] [phone]   Name: Brittany Wheeler Date: 04/19/2021 MRN: [id number]  DOB: 1945/08/25  Patient Care Team: Brittany Body, Brittany Wheeler as PCP - General (Family Medicine) Brittany Sable, Brittany Wheeler as PCP - Cardiology (Cardiology) Brittany Nab, RN as Oncology Nurse Navigator    REASON FOR CONSULTATION: Brittany Wheeler is a 75 y.o. 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 Patient was hospitalized in July 2022 for shortness of breath and was found to have a large bulky central right-sided lung mass.  Patient underwent bronchoscopy on 03/06/2021 with pathology favoring squamous cell carcinoma.  PET scan on 03/13/2021 revealed hypermetabolic large right middle lobe lung mass with extensive invasion of the right hilum and mediastinum.  Patient had weekly hypermetabolic small bilateral supraclavicular nodes but no findings to suggest distal metastatic disease.  Palliative care was consulted to address goals and manage ongoing symptoms.  SOCIAL HISTORY:     reports that she quit smoking about 10 years ago. Her smoking use included cigarettes. She has never used smokeless tobacco. She reports that she does not currently use alcohol. She reports that she does not use drugs.  Patient was never married and has no children.  She lives at home alone with two Brittany Wheeler and Brittany Wheeler.  She has a neighbor, Brittany Wheeler, who is her healthcare power of attorney.  Patient has a cousin from out of state.  ADVANCE DIRECTIVES:  On file  CODE STATUS: DNR  PAST MEDICAL HISTORY: Past Medical History:  Diagnosis Date   Anxiety    Arthritis    Asthma    Cancer ([place])    benign mass removed   Complication of anesthesia    Dyspnea    Dysrhythmia     Hyperlipidemia    Hypertension    Hypothyroidism    PONV (postoperative nausea and vomiting)    Pre-diabetes    Thyroid disease     PAST SURGICAL HISTORY:  Past Surgical History:  Procedure Laterality Date   COLONOSCOPY     FLEXIBLE BRONCHOSCOPY Bilateral 03/06/2021   Procedure: FLEXIBLE BRONCHOSCOPY;  Surgeon: Brittany Gee, Brittany Wheeler;  Location: [facility];  Service: Pulmonary;  Laterality: Bilateral;   PORTA CATH INSERTION N/A 03/13/2021   Procedure: PORTA CATH INSERTION;  Surgeon: Brittany Huxley, Brittany Wheeler;  Location: [facility];  Service: Cardiovascular;  Laterality: N/A;   stent placed in kidney     THYROIDECTOMY      HEMATOLOGY/ONCOLOGY HISTORY:  Oncology History  Primary cancer of right middle lobe of lung ([place])  03/07/2021 Initial Diagnosis   Primary cancer of right middle lobe of lung ([place])   03/07/2021 Cancer Staging   Staging form: Lung, AJCC 8th Edition - Clinical stage from 03/07/2021: Stage IIIB (cT4, cN2, cM0) - Signed by Brittany Huger, Brittany Wheeler on 03/07/2021 Stage prefix: Initial diagnosis   03/15/2021 -  Chemotherapy    Patient is on Treatment Plan: LUNG CARBOPLATIN / PACLITAXEL + XRT Q7D         ALLERGIES:  is allergic to atorvastatin, latex, lovastatin, and penicillins.  MEDICATIONS:  Current Outpatient Medications  Medication Sig Dispense Refill   ALPRAZolam (XANAX) 0.25 MG tablet Take 1 tablet (0.25 mg total) by mouth 2 (two) times daily as needed for anxiety. 60 tablet 0   apixaban (ELIQUIS) 5 MG TABS tablet Take 1 tablet (5  mg total) by mouth 2 (two) times daily. 60 tablet 5   Budeson-Glycopyrrol-Formoterol (BREZTRI AEROSPHERE) 160-9-4.8 MCG/ACT AERO Inhale 1 spray into the lungs 2 (two) times daily. 10.7 g 2   dexamethasone (DECADRON) 4 MG tablet Take 1 tablet (4 mg total) by mouth daily. 30 tablet 0   diltiazem (CARDIZEM CD) 240 MG 24 hr capsule Take 1 capsule (240 mg total) by mouth daily. To control your heart rate from Afib. 30 capsule 2   ezetimibe (ZETIA)  10 MG tablet TAKE 1 TABLET(10 MG) BY MOUTH EVERY DAY (Patient not taking: No sig reported)     fluconazole (DIFLUCAN) 150 MG tablet Take 1 tablet (150 mg total) by mouth daily. (Patient not taking: No sig reported) 7 tablet 0   fluticasone (FLONASE) 50 MCG/ACT nasal spray Place 1 spray into both nostrils in the morning and at bedtime. 15 mL 1   guaiFENesin-dextromethorphan (ROBITUSSIN DM) 100-10 MG/5ML syrup Take 10 mLs by mouth every 6 (six) hours as needed for cough. 118 mL 0   hydrochlorothiazide (HYDRODIURIL) 12.5 MG tablet Take 12.5 mg by mouth daily.     ipratropium (ATROVENT) 0.06 % nasal spray Place 2 sprays into both nostrils 3 (three) times daily.     ipratropium-albuterol (DUONEB) 0.5-2.5 (3) MG/3ML SOLN Take 3 mLs by nebulization every 6 (six) hours as needed. 120 mL 2   levocetirizine (XYZAL) 5 MG tablet Take 5 mg by mouth daily as needed.     levothyroxine (SYNTHROID) 112 MCG tablet Take 112 mcg by mouth daily.     lidocaine-prilocaine (EMLA) cream Apply 1 application topically as needed. 30 g 0   Multiple Vitamin (MULTI-VITAMIN) tablet Take 1 tablet by mouth daily.     ondansetron (ZOFRAN) 8 MG tablet Take 1 tablet (8 mg total) by mouth 2 (two) times daily as needed for refractory nausea / vomiting. 60 tablet 1   PARoxetine (PAXIL) 20 MG tablet Take 30 mg by mouth daily.     potassium chloride SA (KLOR-CON) 20 MEQ tablet Take 1 tablet (20 mEq total) by mouth daily. 30 tablet 0   prochlorperazine (COMPAZINE) 10 MG tablet TAKE 1 TABLET BY MOUTH EVERY 6 HOURS AS NEEDED FOR NAUSEA OR VOMITING 60 tablet 1   No current facility-administered medications for this visit.   Facility-Administered Medications Ordered in Other Visits  Medication Dose Route Frequency Provider Last Rate Last Admin   CARBOplatin (PARAPLATIN) 160 mg in sodium chloride 0.9 % 100 mL chemo infusion  160 mg Intravenous Once Brittany Huger, Brittany Wheeler       heparin lock flush 100 unit/mL  500 Units Intracatheter Once PRN  Brittany Huger, Brittany Wheeler       PACLitaxel (TAXOL) 78 mg in sodium chloride 0.9 % 250 mL chemo infusion (</= 84m/m2)  45 mg/m2 (Treatment Plan Recorded) Intravenous Once Brittany Huger Brittany Wheeler 263 mL/hr at 04/19/21 1128 78 mg at 04/19/21 1128   sodium chloride flush (NS) 0.9 % injection 10 mL  10 mL Intravenous PRN Brittany Huger Brittany Wheeler   10 mL at 04/12/21 0851    VITAL SIGNS: There were no vitals taken for this visit. There were no vitals filed for this visit.  Estimated Wheeler mass index is 26.21 kg/m as calculated from the following:   Height as of 03/13/21: _0  (1.549 m).   Weight as of an earlier encounter on 04/19/21: 138 lb 11.2 oz (62.9 kg).  LABS: CBC:    Component Value Date/Time   WBC 2.1 (L) 04/19/2021  0263   HGB 9.7 (L) 04/19/2021 0829   HCT 28.9 (L) 04/19/2021 0829   PLT 173 04/19/2021 0829   MCV 88.1 04/19/2021 0829   NEUTROABS 1.6 (L) 04/19/2021 0829   LYMPHSABS 0.1 (L) 04/19/2021 0829   MONOABS 0.2 04/19/2021 0829   EOSABS 0.0 04/19/2021 0829   BASOSABS 0.0 04/19/2021 0829   Comprehensive Metabolic Panel:    Component Value Date/Time   NA 130 (L) 04/19/2021 0829   K 3.7 04/19/2021 0829   CL 94 (L) 04/19/2021 0829   CO2 26 04/19/2021 0829   BUN 22 04/19/2021 0829   CREATININE 0.67 04/19/2021 0829   GLUCOSE 146 (H) 04/19/2021 0829   CALCIUM 6.5 (L) 04/19/2021 0829   AST 23 04/19/2021 0829   ALT 126 (H) 04/19/2021 0829   ALKPHOS 90 04/19/2021 0829   BILITOT 0.6 04/19/2021 0829   PROT 6.1 (L) 04/19/2021 0829   ALBUMIN 3.0 (L) 04/19/2021 0829    RADIOGRAPHIC STUDIES: US Venous Img Upper Uni Left  Result Date: 03/29/2021 CLINICAL DATA:  left arm swelling/receiving treatment for lung cancer EXAM: LEFT UPPER EXTREMITY VENOUS DOPPLER ULTRASOUND TECHNIQUE: Gray-scale sonography with graded compression, as well as color Doppler and duplex ultrasound were performed to evaluate the upper extremity deep venous system from the level of the subclavian vein and  including the jugular, axillary, basilic, radial, ulnar and upper cephalic vein. Spectral Doppler was utilized to evaluate flow at rest and with distal augmentation maneuvers. COMPARISON:  None. FINDINGS: Contralateral Subclavian Vein: Respiratory phasicity is normal and symmetric with the symptomatic side. No evidence of thrombus. Normal compressibility. Internal Jugular Vein: No evidence of thrombus. Normal compressibility, respiratory phasicity and response to augmentation. Subclavian Vein: No evidence of thrombus. Normal compressibility, respiratory phasicity and response to augmentation. Axillary Vein: No evidence of thrombus. Normal compressibility, respiratory phasicity and response to augmentation. Cephalic Vein: No evidence of thrombus. Normal compressibility, respiratory phasicity and response to augmentation. Basilic Vein: No evidence of thrombus. Normal compressibility, respiratory phasicity and response to augmentation. Brachial Veins: No evidence of thrombus. Normal compressibility, respiratory phasicity and response to augmentation. Radial Veins: No evidence of thrombus. Normal compressibility, respiratory phasicity and response to augmentation. Ulnar Veins: No evidence of thrombus. Normal compressibility, respiratory phasicity and response to augmentation. Other Findings:  None visualized. IMPRESSION: No evidence of DVT within the left upper extremity. Electronically Signed   By: Albin Felling M.D.   On: 03/29/2021 14:17   MM 3D SCREEN BREAST BILATERAL  Result Date: 04/05/2021 CLINICAL DATA:  Screening. EXAM: DIGITAL SCREENING BILATERAL MAMMOGRAM WITH TOMOSYNTHESIS AND CAD TECHNIQUE: Bilateral screening digital craniocaudal and mediolateral oblique mammograms were obtained. Bilateral screening digital breast tomosynthesis was performed. The images were evaluated with computer-aided detection. COMPARISON:  Previous exam(s). ACR Breast Density Category b: There are scattered areas of  fibroglandular density. FINDINGS: In the right breast, a possible asymmetry warrants further evaluation. In the left breast, no findings suspicious for malignancy. IMPRESSION: Further evaluation is suggested for possible asymmetry in the right breast. RECOMMENDATION: Diagnostic mammogram and possibly ultrasound of the right breast. (Code:[id number]) The patient will be contacted regarding the findings, and additional imaging will be scheduled. BI-RADS CATEGORY  0: Incomplete. Need additional imaging evaluation and/or prior mammograms for comparison. Electronically Signed   By: Audie Pinto M.D.   On: 04/05/2021 16:19  LONG TERM MONITOR (3-14 DAYS)  Result Date: 04/07/2021 Patch Wear Time:  14 days and 0 hours (2022-08-10T17:30:29-0400 to 2022-08-24T17:30:33-0400) Patient had a min HR of 51 bpm, max HR of  176 bpm, and avg HR of 92 bpm. Predominant underlying rhythm was Sinus Rhythm. 8 Supraventricular Tachycardia runs occurred, the run with the fastest interval lasting 9 beats with a max rate of 176 bpm, the longest lasting 18 beats with an avg rate of 125 bpm. Atrial Fibrillation occurred (22% burden), ranging from 54-169 bpm (avg of 92 bpm), the longest lasting 3 hours 54 mins with an avg rate of 86 bpm.   MM Outside Films Mammo  Result Date: 04/04/2021 This examination belongs to an outside facility and is stored here for comparison purposes only.  Contact the originating outside institution for any associated report or interpretation.   PERFORMANCE STATUS (ECOG) : 2 - Symptomatic, <50% confined to bed  Review of Systems Unless otherwise noted, a complete review of systems is negative.  Physical Exam General: NAD Pulmonary: Unlabored Extremities: no edema, no joint deformities Skin: no rashes Neurological: Weakness but otherwise nonfocal  IMPRESSION: Follow-up visit.  Patient seen in infusion.  Patient reports that she is doing well.  She denies significant changes or concerns.  Appetite  has improved.  She feels less fatigued but remains generally weak.  She has no symptomatic complaints at present.  Discussed overall goals.  Patient's cousin is coming on 10/3 to take patient back to [place] to live.  Patient currently says that she would like to finish immunotherapy and transfer care to an oncologist in [place].  She understands that family will have to coordinate that locally.     Durable Medical Equipment  (From admission, onward)           Start     Ordered   04/19/21 1125  For home use only DME lightweight manual wheelchair with seat cushion  Once       Comments: Patient suffers from lung cancer which impairs their ability to perform daily activities like bathing and dressing in the home.  A cane, crutch, or walker will not resolve  issue with performing activities of daily living. A wheelchair will allow patient to safely perform daily activities. Patient is not able to propel themselves in the home using a standard weight wheelchair due to endurance and general weakness. Patient can self propel in the lightweight wheelchair. Length of need 12 months . Accessories: elevating leg rests (ELRs), wheel locks, extensions and anti-tippers.   04/19/21 1128             PLAN: -Continue current scope of treatment -Continue dexamethasone daily for appetite/fatigue -Continue Paxil 30 mg daily -Transport wheelchair -RTC next week  Case and plan discussed with Dr. [name]    Patient expressed understanding and was in agreement with this plan. She also understands that She can call the clinic at any time with any questions, concerns, or complaints.     Time Total: 15 minutes  Visit consisted of counseling and education dealing with the complex and emotionally intense issues of symptom management and palliative care in the setting of serious and potentially life-threatening illness.Greater than 50%  of this time was spent counseling and coordinating care  related to the above assessment and plan.  Signed by: Altha Harm, PhD, NP-C

## 2021-04-19 NOTE — Progress Notes (Signed)
ALT resulted at 126 from 33. Calcium 6.5. Per Dr. [name] proceed with treatment. No intervention needed during infusion for Calcium.

## 2021-04-19 NOTE — Progress Notes (Signed)
Nutrition Follow-up:  Patient with lung cancer and receiving concurrent chemotherapy and radiation.   Called and spoke with [facility].  [name] reports that patient had a rough weekend, decreased appetite.  Appetite is some better.  Drinking boost plus shakes which she likes.  Patient has met with home based Palliative care.  Cousin planning on coming on Oct 3rd to take patient back to [place] to live.    RD spoke with patient during infusion.  Says that she is eating a little bit better.   Glad that weight stayed the same this week    Medications: reviewed  Labs: reviewed  Anthropometrics:   Weight 138 lb  140 lb 8 oz on 8/17 156 lb 8/10 149 lb on 8/8 152 lb on 8/2   NUTRITION DIAGNOSIS: Inadequate oral intake continues    INTERVENTION:  Provided patient with coupons for boost products.  Samples of ensure, orgain, reason, carnation breakfast essentials packet given to patient. Samples that were previously given never made it home Encouraged patient to continue to eat high calorie, high protein foods as able Continue boost plus shake.     MONITORING, EVALUATION, GOAL: weight trends, intake   NEXT VISIT: as needed  Mahasin Riviere B. Zenia Resides, [place], [place] Registered Dietitian [phone] (mobile)

## 2021-04-19 NOTE — Progress Notes (Signed)
Pt states she "had a rough weekend" and will be seeing Josh, NP. Home health nurse came out and spoke with pt. Pt states she is "feeling better." No new complaints at this time.

## 2021-04-20 ENCOUNTER — Encounter: Payer: Self-pay | Admitting: Oncology

## 2021-04-20 ENCOUNTER — Ambulatory Visit
Admission: RE | Admit: 2021-04-20 | Discharge: 2021-04-20 | Disposition: A | Discharge status: Home / Self Care | Payer: Medicare Other | Source: Ambulatory Visit | Attending: Radiation Oncology | Admitting: Radiation Oncology

## 2021-04-20 DIAGNOSIS — R918 Other nonspecific abnormal finding of lung field: Secondary | ICD-10-CM | POA: Diagnosis not present

## 2021-04-20 MED ORDER — CALCIUM CARBONATE-VITAMIN D 500-200 MG-UNIT PO TABS: 1.0000 | ORAL_TABLET | Freq: Two times a day (BID) | ORAL | 2 refills | Status: AC

## 2021-04-20 NOTE — Addendum Note (Signed)
Addended by: Altha Harm R on: 04/20/2021 09:04 AM   Modules accepted: Orders

## 2021-04-21 ENCOUNTER — Ambulatory Visit
Admission: RE | Admit: 2021-04-21 | Discharge: 2021-04-21 | Disposition: A | Discharge status: Home / Self Care | Payer: Medicare Other | Source: Ambulatory Visit | Attending: Radiation Oncology | Admitting: Radiation Oncology

## 2021-04-21 DIAGNOSIS — R918 Other nonspecific abnormal finding of lung field: Secondary | ICD-10-CM | POA: Diagnosis not present

## 2021-04-24 ENCOUNTER — Ambulatory Visit
Admission: RE | Admit: 2021-04-24 | Discharge: 2021-04-24 | Disposition: A | Discharge status: Home / Self Care | Payer: Medicare Other | Source: Ambulatory Visit | Attending: Radiation Oncology | Admitting: Radiation Oncology

## 2021-04-24 ENCOUNTER — Other Ambulatory Visit: Payer: Self-pay | Admitting: *Deleted

## 2021-04-24 DIAGNOSIS — R918 Other nonspecific abnormal finding of lung field: Secondary | ICD-10-CM | POA: Diagnosis not present

## 2021-04-24 MED ORDER — ALPRAZOLAM 0.25 MG PO TABS: 0.2500 mg | ORAL_TABLET | Freq: Two times a day (BID) | ORAL | 1 refills | Status: AC | PRN

## 2021-04-24 MED ORDER — PAROXETINE HCL 20 MG PO TABS: 30.0000 mg | ORAL_TABLET | Freq: Every day | ORAL | 2 refills | Status: AC

## 2021-04-24 NOTE — Progress Notes (Signed)
Brittany Wheeler  [phone]) [phone] Fax:(336) [phone]  ID: Oneita Kras OB: June 16, 1946  MR#: [id number]  OAC#:[id number]  Patient Care Team: [name], MD as PCP - General (Family Medicine) [name], MD as PCP - Cardiology (Cardiology) [name], RN as Oncology Nurse Navigator  CHIEF COMPLAINT:Stage IIIb lung cancer.  INTERVAL HISTORY: Patient returns to clinic today for further evaluation and consideration of her seventh and final infusion of weekly carboplatin and Taxol.  She continues to have chronic weakness and fatigue and a poor appetite. She continues to have shortness of breath and cough.  Her left arm swelling is nearly resolved.  She denies any pain.  She has no neurologic complaints.  She denies any recent fevers.  She denies any chest pain or hemoptysis.  She has no nausea, vomiting, constipation, or diarrhea.  She has no urinary complaints.  Patient offers no further specific complaints today.  REVIEW OF SYSTEMS:   Review of Systems  Constitutional:  Positive for malaise/fatigue. Negative for fever and weight loss.  Respiratory:  Positive for cough and shortness of breath. Negative for hemoptysis.   Cardiovascular: Negative.  Negative for chest pain and leg swelling.  Gastrointestinal: Negative.  Negative for abdominal pain and nausea.  Genitourinary: Negative.  Negative for dysuria.  Musculoskeletal: Negative.  Negative for back pain.  Skin: Negative.  Negative for itching and rash.  Neurological:  Positive for weakness. Negative for dizziness, focal weakness and headaches.  Psychiatric/Behavioral: Negative.  The patient is not nervous/anxious.    As per HPI. Otherwise, a complete review of systems is negative.  PAST MEDICAL HISTORY: Past Medical History:  Diagnosis Date   Anxiety    Arthritis    Asthma    Cancer ([place])    benign mass removed   Complication of anesthesia    Dyspnea    Dysrhythmia    Hyperlipidemia     Hypertension    Hypothyroidism    PONV (postoperative nausea and vomiting)    Pre-diabetes    Thyroid disease     PAST SURGICAL HISTORY: Past Surgical History:  Procedure Laterality Date   COLONOSCOPY     FLEXIBLE BRONCHOSCOPY Bilateral 03/06/2021   Procedure: FLEXIBLE BRONCHOSCOPY;  Surgeon: [name], MD;  Location: [facility];  Service: Pulmonary;  Laterality: Bilateral;   PORTA CATH INSERTION N/A 03/13/2021   Procedure: PORTA CATH INSERTION;  Surgeon: [name], MD;  Location: [facility];  Service: Cardiovascular;  Laterality: N/A;   stent placed in kidney     THYROIDECTOMY      FAMILY HISTORY: Family History  Problem Relation Age of Onset   Mesothelioma Father    Rectal cancer Sister     ADVANCED DIRECTIVES (Y/N):  N  HEALTH MAINTENANCE: Social History   Tobacco Use   Smoking status: Former    Types: Cigarettes    Quit date: 2012    Years since quitting: 10.7   Smokeless tobacco: Never  Vaping Use   Vaping Use: Never used  Substance Use Topics   Alcohol use: Not Currently   Drug use: Never     Colonoscopy:  PAP:  Bone density:  Lipid panel:  Allergies  Allergen Reactions   Atorvastatin Other (See Comments)    Joint pain   Latex    Lovastatin Other (See Comments)    Arthritis pain in hands   Penicillins     Current Outpatient Medications  Medication Sig Dispense Refill   ALPRAZolam (XANAX) 0.25 MG tablet Take  1 tablet (0.25 mg total) by mouth 2 (two) times daily as needed for anxiety. 60 tablet 1   apixaban (ELIQUIS) 5 MG TABS tablet Take 1 tablet (5 mg total) by mouth 2 (two) times daily. 60 tablet 5   Budeson-Glycopyrrol-Formoterol (BREZTRI AEROSPHERE) 160-9-4.8 MCG/ACT AERO Inhale 1 spray into the lungs 2 (two) times daily. 10.7 g 2   calcium-vitamin D (OSCAL WITH D) 500-200 MG-UNIT tablet Take 1 tablet by mouth 2 (two) times daily. 60 tablet 2   dexamethasone (DECADRON) 4 MG tablet Take 1 tablet (4 mg total) by mouth daily. 30  tablet 0   diltiazem (CARDIZEM CD) 240 MG 24 hr capsule Take 1 capsule (240 mg total) by mouth daily. To control your heart rate from Afib. 30 capsule 2   fluticasone (FLONASE) 50 MCG/ACT nasal spray Place 1 spray into both nostrils in the morning and at bedtime. 15 mL 1   guaiFENesin-dextromethorphan (ROBITUSSIN DM) 100-10 MG/5ML syrup Take 10 mLs by mouth every 6 (six) hours as needed for cough. 118 mL 0   hydrochlorothiazide (HYDRODIURIL) 12.5 MG tablet Take 12.5 mg by mouth daily.     ipratropium (ATROVENT) 0.06 % nasal spray Place 2 sprays into both nostrils 3 (three) times daily.     ipratropium-albuterol (DUONEB) 0.5-2.5 (3) MG/3ML SOLN Take 3 mLs by nebulization every 6 (six) hours as needed. 120 mL 2   levocetirizine (XYZAL) 5 MG tablet Take 5 mg by mouth daily as needed.     levothyroxine (SYNTHROID) 112 MCG tablet Take 112 mcg by mouth daily.     lidocaine-prilocaine (EMLA) cream Apply 1 application topically as needed. 30 g 0   Multiple Vitamin (MULTI-VITAMIN) tablet Take 1 tablet by mouth daily.     ondansetron (ZOFRAN) 8 MG tablet Take 1 tablet (8 mg total) by mouth 2 (two) times daily as needed for refractory nausea / vomiting. 60 tablet 1   PARoxetine (PAXIL) 20 MG tablet Take 1.5 tablets (30 mg total) by mouth daily. 30 tablet 2   potassium chloride SA (KLOR-CON) 20 MEQ tablet Take 1 tablet (20 mEq total) by mouth daily. 30 tablet 0   prochlorperazine (COMPAZINE) 10 MG tablet TAKE 1 TABLET BY MOUTH EVERY 6 HOURS AS NEEDED FOR NAUSEA OR VOMITING 60 tablet 1   ezetimibe (ZETIA) 10 MG tablet TAKE 1 TABLET(10 MG) BY MOUTH EVERY DAY (Patient not taking: No sig reported)     fluconazole (DIFLUCAN) 150 MG tablet Take 1 tablet (150 mg total) by mouth daily. (Patient not taking: Reported on 04/26/2021) 7 tablet 0   No current facility-administered medications for this visit.   Facility-Administered Medications Ordered in Other Visits  Medication Dose Route Frequency Provider Last Rate  Last Admin   sodium chloride flush (NS) 0.9 % injection 10 mL  10 mL Intravenous PRN [name], MD   10 mL at 04/12/21 0851    OBJECTIVE: Vitals:   04/26/21 1006  BP: 122/72  Pulse: 89  Resp: 18  SpO2: 99%     Body mass index is 25.3 kg/m.    ECOG FS:2 - Symptomatic, <50% confined to bed  General: Well-developed, well-nourished, no acute distress.  Sitting in a wheelchair. Eyes: Pink conjunctiva, anicteric sclera. HEENT: Normocephalic, moist mucous membranes. Lungs: No audible wheezing or coughing. Heart: Regular rate and rhythm. Abdomen: Soft, nontender, no obvious distention. Musculoskeletal: No edema, cyanosis, or clubbing. Neuro: Alert, answering all questions appropriately. Cranial nerves grossly intact. Skin: No rashes or petechiae noted. Psych: Normal affect.  LAB RESULTS:  Lab Results  Component Value Date   NA 132 (L) 04/26/2021   K 3.9 04/26/2021   CL 95 (L) 04/26/2021   CO2 25 04/26/2021   GLUCOSE 202 (H) 04/26/2021   BUN 27 (H) 04/26/2021   CREATININE 0.78 04/26/2021   CALCIUM 6.6 (L) 04/26/2021   PROT 6.2 (L) 04/26/2021   ALBUMIN 3.1 (L) 04/26/2021   AST 21 04/26/2021   ALT 51 (H) 04/26/2021   ALKPHOS 58 04/26/2021   BILITOT 0.5 04/26/2021   GFRNONAA >60 04/26/2021    Lab Results  Component Value Date   WBC 1.5 (L) 04/26/2021   NEUTROABS 1.1 (L) 04/26/2021   HGB 9.7 (L) 04/26/2021   HCT 29.3 (L) 04/26/2021   MCV 87.2 04/26/2021   PLT 169 04/26/2021     STUDIES: US Venous Img Upper Uni Left  Result Date: 03/29/2021 CLINICAL DATA:  left arm swelling/receiving treatment for lung cancer EXAM: LEFT UPPER EXTREMITY VENOUS DOPPLER ULTRASOUND TECHNIQUE: Gray-scale sonography with graded compression, as well as color Doppler and duplex ultrasound were performed to evaluate the upper extremity deep venous system from the level of the subclavian vein and including the jugular, axillary, basilic, radial, ulnar and upper cephalic vein. Spectral  Doppler was utilized to evaluate flow at rest and with distal augmentation maneuvers. COMPARISON:  None. FINDINGS: Contralateral Subclavian Vein: Respiratory phasicity is normal and symmetric with the symptomatic side. No evidence of thrombus. Normal compressibility. Internal Jugular Vein: No evidence of thrombus. Normal compressibility, respiratory phasicity and response to augmentation. Subclavian Vein: No evidence of thrombus. Normal compressibility, respiratory phasicity and response to augmentation. Axillary Vein: No evidence of thrombus. Normal compressibility, respiratory phasicity and response to augmentation. Cephalic Vein: No evidence of thrombus. Normal compressibility, respiratory phasicity and response to augmentation. Basilic Vein: No evidence of thrombus. Normal compressibility, respiratory phasicity and response to augmentation. Brachial Veins: No evidence of thrombus. Normal compressibility, respiratory phasicity and response to augmentation. Radial Veins: No evidence of thrombus. Normal compressibility, respiratory phasicity and response to augmentation. Ulnar Veins: No evidence of thrombus. Normal compressibility, respiratory phasicity and response to augmentation. Other Findings:  None visualized. IMPRESSION: No evidence of DVT within the left upper extremity. Electronically Signed   By: Albin Felling M.D.   On: 03/29/2021 14:17   MM 3D SCREEN BREAST BILATERAL  Result Date: 04/05/2021 CLINICAL DATA:  Screening. EXAM: DIGITAL SCREENING BILATERAL MAMMOGRAM WITH TOMOSYNTHESIS AND CAD TECHNIQUE: Bilateral screening digital craniocaudal and mediolateral oblique mammograms were obtained. Bilateral screening digital breast tomosynthesis was performed. The images were evaluated with computer-aided detection. COMPARISON:  Previous exam(s). ACR Breast Density Category b: There are scattered areas of fibroglandular density. FINDINGS: In the right breast, a possible asymmetry warrants further  evaluation. In the left breast, no findings suspicious for malignancy. IMPRESSION: Further evaluation is suggested for possible asymmetry in the right breast. RECOMMENDATION: Diagnostic mammogram and possibly ultrasound of the right breast. (Code:[id number]) The patient will be contacted regarding the findings, and additional imaging will be scheduled. BI-RADS CATEGORY  0: Incomplete. Need additional imaging evaluation and/or prior mammograms for comparison. Electronically Signed   By: Audie Pinto M.D.   On: 04/05/2021 16:19  LONG TERM MONITOR (3-14 DAYS)  Result Date: 04/07/2021 Patch Wear Time:  14 days and 0 hours (2022-08-10T17:30:29-0400 to 2022-08-24T17:30:33-0400) Patient had a min HR of 51 bpm, max HR of 176 bpm, and avg HR of 92 bpm. Predominant underlying rhythm was Sinus Rhythm. 8 Supraventricular Tachycardia runs occurred, the run with the fastest interval lasting  9 beats with a max rate of 176 bpm, the longest lasting 18 beats with an avg rate of 125 bpm. Atrial Fibrillation occurred (22% burden), ranging from 54-169 bpm (avg of 92 bpm), the longest lasting 3 hours 54 mins with an avg rate of 86 bpm.   MM Outside Films Mammo  Result Date: 04/04/2021 This examination belongs to an outside facility and is stored here for comparison purposes only.  Contact the originating outside institution for any associated report or interpretation.   ASSESSMENT: Stage IIIb lung cancer.  PLAN:    Stage IIIb lung cancer: CT scan results from February 27, 2021 reviewed independently and reported as above with bulky 7 cm right middle lobe mass invading right hilum with bulky mediastinal lymphadenopathy highly suspicious for underlying bronchogenic malignancy.  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  MRI of the brain on February 28, 2021 reviewed independently with no obvious metastatic disease.  PET scan on March 14, 2021 confirmed staging with no obvious evidence of metastatic  disease.  Patient has now had port placement.  Proceed with her sixth and final treatment of weekly carboplatinum and Taxol today.  Continue XRT completing treatment on April 27, 2021.  Patient is moving to [place] in approximately 2 weeks.  Recommended maintenance durvalumab after completing XRT.  We previously discussed hospice, but patient is not interested at this time.  No follow-up has been scheduled at this time. Atrial fibrillation: Continue Cardizem and Eliquis. Appreciate cardiology input. Anxiety: Chronic and unchanged.  Continue Xanax as prescribed. Weakness and fatigue: Chronic and unchanged.  Continue home health.  Patient is now moving to [place] as above.  Hospice was considered, but patient wishes to continue treatment. Left arm swelling: Nearly resolved.  Ultrasound negative for DVT.  Monitor. Poor appetite: Improved.  Continue 10m dexamethasone daily. Hypokalemia: Resolved.  Continue oral potassium supplementation. Leukopenia: Patient's white blood cell count and neutrophil count remain decreased, but adequate to proceed with treatment. Anemia: Chronic and unchanged.  Patient's hemoglobin is stable at 9.7.  Hyponatremia: Chronic and unchanged.  Patient's sodium is stable at 132.  Hypocalcemia: Monitor.  Patient expressed understanding and was in agreement with this plan. She also understands that She can call clinic at any time with any questions, concerns, or complaints.   Cancer Staging Primary cancer of right middle lobe of lung ([facility] Staging form: Lung, AJCC 8th Edition - Clinical stage from 03/07/2021: Stage IIIB (cT4, cN2, cM0) - Signed by FLloyd Huger MD on 03/07/2021 Stage prefix: Initial diagnosis  TLloyd Huger MD   04/28/2021 7:35 AM

## 2021-04-25 ENCOUNTER — Telehealth: Payer: Self-pay | Admitting: Emergency Medicine

## 2021-04-25 ENCOUNTER — Ambulatory Visit
Admission: RE | Admit: 2021-04-25 | Discharge: 2021-04-25 | Disposition: A | Discharge status: Home / Self Care | Payer: Medicare Other | Source: Ambulatory Visit | Attending: Radiation Oncology | Admitting: Radiation Oncology

## 2021-04-25 DIAGNOSIS — R918 Other nonspecific abnormal finding of lung field: Secondary | ICD-10-CM | POA: Diagnosis not present

## 2021-04-25 NOTE — Telephone Encounter (Signed)
Prior Authorization [employer]orestine Avers [id number]) approved. Faxed approval to [facility]

## 2021-04-25 NOTE — Telephone Encounter (Signed)
Prior auth for paroxetine submitted. Key [id number]

## 2021-04-26 ENCOUNTER — Inpatient Hospital Stay: Payer: Medicare Other

## 2021-04-26 ENCOUNTER — Inpatient Hospital Stay (HOSPITAL_BASED_OUTPATIENT_CLINIC_OR_DEPARTMENT_OTHER): Payer: Medicare Other | Admitting: Oncology

## 2021-04-26 ENCOUNTER — Ambulatory Visit
Admission: RE | Admit: 2021-04-26 | Discharge: 2021-04-26 | Disposition: A | Discharge status: Home / Self Care | Payer: Medicare Other | Source: Ambulatory Visit | Attending: Radiation Oncology | Admitting: Radiation Oncology

## 2021-04-26 ENCOUNTER — Other Ambulatory Visit: Payer: Self-pay | Admitting: Emergency Medicine

## 2021-04-26 ENCOUNTER — Inpatient Hospital Stay (HOSPITAL_BASED_OUTPATIENT_CLINIC_OR_DEPARTMENT_OTHER): Payer: Medicare Other | Admitting: Hospice and Palliative Medicine

## 2021-04-26 ENCOUNTER — Other Ambulatory Visit: Payer: Self-pay

## 2021-04-26 VITALS — BP 122/72 | HR 89 | Resp 18 | Wt 133.9 lb

## 2021-04-26 VITALS — Temp 96.4°F

## 2021-04-26 DIAGNOSIS — C342 Malignant neoplasm of middle lobe, bronchus or lung: Secondary | ICD-10-CM

## 2021-04-26 DIAGNOSIS — R918 Other nonspecific abnormal finding of lung field: Secondary | ICD-10-CM | POA: Diagnosis not present

## 2021-04-26 DIAGNOSIS — Z515 Encounter for palliative care: Secondary | ICD-10-CM

## 2021-04-26 LAB — CBC WITH DIFFERENTIAL/PLATELET
Abs Immature Granulocytes: 0.11 10*3/uL — ABNORMAL HIGH (ref 0.00–0.07)
Basophils Absolute: 0 10*3/uL (ref 0.0–0.1)
Basophils Relative: 2 %
Eosinophils Absolute: 0 10*3/uL (ref 0.0–0.5)
Eosinophils Relative: 0 %
HCT: 29.3 % — ABNORMAL LOW (ref 36.0–46.0)
Hemoglobin: 9.7 g/dL — ABNORMAL LOW (ref 12.0–15.0)
Immature Granulocytes: 7 %
Lymphocytes Relative: 8 %
Lymphs Abs: 0.1 10*3/uL — ABNORMAL LOW (ref 0.7–4.0)
MCH: 28.9 pg (ref 26.0–34.0)
MCHC: 33.1 g/dL (ref 30.0–36.0)
MCV: 87.2 fL (ref 80.0–100.0)
Monocytes Absolute: 0.2 10*3/uL (ref 0.1–1.0)
Monocytes Relative: 11 %
Neutro Abs: 1.1 10*3/uL — ABNORMAL LOW (ref 1.7–7.7)
Neutrophils Relative %: 72 %
Platelets: 169 10*3/uL (ref 150–400)
RBC: 3.36 MIL/uL — ABNORMAL LOW (ref 3.87–5.11)
RDW: 15.3 % (ref 11.5–15.5)
WBC: 1.5 10*3/uL — ABNORMAL LOW (ref 4.0–10.5)
nRBC: 0 % (ref 0.0–0.2)

## 2021-04-26 LAB — COMPREHENSIVE METABOLIC PANEL
ALT: 51 U/L — ABNORMAL HIGH (ref 0–44)
AST: 21 U/L (ref 15–41)
Albumin: 3.1 g/dL — ABNORMAL LOW (ref 3.5–5.0)
Alkaline Phosphatase: 58 U/L (ref 38–126)
Anion gap: 12 (ref 5–15)
BUN: 27 mg/dL — ABNORMAL HIGH (ref 8–23)
CO2: 25 mmol/L (ref 22–32)
Calcium: 6.6 mg/dL — ABNORMAL LOW (ref 8.9–10.3)
Chloride: 95 mmol/L — ABNORMAL LOW (ref 98–111)
Creatinine, Ser: 0.78 mg/dL (ref 0.44–1.00)
GFR, Estimated: >60 mL/min (ref >60)
Glucose, Bld: 202 mg/dL — ABNORMAL HIGH (ref 70–99)
Potassium: 3.9 mmol/L (ref 3.5–5.1)
Sodium: 132 mmol/L — ABNORMAL LOW (ref 135–145)
Total Bilirubin: 0.5 mg/dL (ref 0.3–1.2)
Total Protein: 6.2 g/dL — ABNORMAL LOW (ref 6.5–8.1)

## 2021-04-26 MED ORDER — SODIUM CHLORIDE 0.9 % IV SOLN
Freq: Once | INTRAVENOUS | Status: AC
  Filled 2021-04-26: qty 250

## 2021-04-26 MED ORDER — HEPARIN SOD (PORK) LOCK FLUSH 100 UNIT/ML IV SOLN
INTRAVENOUS | Status: AC
  Administered 2021-04-26: 500 [IU] via INTRAVENOUS
  Filled 2021-04-26: qty 5

## 2021-04-26 MED ORDER — SODIUM CHLORIDE 0.9% FLUSH
10.0000 mL | INTRAVENOUS | Status: DC | PRN
  Administered 2021-04-26: 10 mL via INTRAVENOUS
  Filled 2021-04-26: qty 10

## 2021-04-26 MED ORDER — PALONOSETRON HCL INJECTION 0.25 MG/5ML
0.2500 mg | Freq: Once | INTRAVENOUS | Status: AC
  Administered 2021-04-26: 0.25 mg via INTRAVENOUS
  Filled 2021-04-26: qty 5

## 2021-04-26 MED ORDER — DIPHENHYDRAMINE HCL 50 MG/ML IJ SOLN
25.0000 mg | Freq: Once | INTRAMUSCULAR | Status: AC
  Administered 2021-04-26: 25 mg via INTRAVENOUS
  Filled 2021-04-26: qty 1

## 2021-04-26 MED ORDER — HEPARIN SOD (PORK) LOCK FLUSH 100 UNIT/ML IV SOLN
500.0000 [IU] | Freq: Once | INTRAVENOUS | Status: AC
  Filled 2021-04-26: qty 5

## 2021-04-26 MED ORDER — FAMOTIDINE 20 MG IN NS 100 ML IVPB
20.0000 mg | Freq: Once | INTRAVENOUS | Status: AC
  Administered 2021-04-26: 20 mg via INTRAVENOUS
  Filled 2021-04-26: qty 20
  Filled 2021-04-26: qty 100

## 2021-04-26 MED ORDER — SODIUM CHLORIDE 0.9 % IV SOLN
45.0000 mg/m2 | Freq: Once | INTRAVENOUS | Status: AC
  Administered 2021-04-26: 78 mg via INTRAVENOUS
  Filled 2021-04-26: qty 13

## 2021-04-26 MED ORDER — SODIUM CHLORIDE 0.9 % IV SOLN
10.0000 mg | Freq: Once | INTRAVENOUS | Status: AC
  Administered 2021-04-26: 10 mg via INTRAVENOUS
  Filled 2021-04-26: qty 10

## 2021-04-26 MED ORDER — SODIUM CHLORIDE 0.9 % IV SOLN
159.2000 mg | Freq: Once | INTRAVENOUS | Status: AC
  Administered 2021-04-26: 160 mg via INTRAVENOUS
  Filled 2021-04-26: qty 16

## 2021-04-26 NOTE — Progress Notes (Signed)
Pt c/o ongoing weakness and nausea. Pt requesting medical records, info on health care POA due to moving to [place] soon. Pt c/o petechiae rash in port area but went away after port was accessed.

## 2021-04-26 NOTE — Progress Notes (Signed)
[place]  [phone] [phone]   Name: DHYANA BASTONE Date: 04/26/2021 MRN: [id number]  DOB: [birth year]  Patient Care Team: [name], MD as PCP - General (Family Medicine) [name], MD as PCP - Cardiology (Cardiology) [name], RN as Oncology Nurse Navigator    REASON FOR CONSULTATION: Brittany Wheeler is a 75 y.o. 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 Patient was hospitalized in July 2022 for shortness of breath and was found to have a large bulky central right-sided lung mass.  Patient underwent bronchoscopy on 03/06/2021 with pathology favoring squamous cell carcinoma.  PET scan on 03/13/2021 revealed hypermetabolic large right middle lobe lung mass with extensive invasion of the right hilum and mediastinum.  Patient had weekly hypermetabolic small bilateral supraclavicular nodes but no findings to suggest distal metastatic disease.  Palliative care was consulted to address goals and manage ongoing symptoms.  SOCIAL HISTORY:     reports that she quit smoking about 10 years ago. Her smoking use included cigarettes. She has never used smokeless tobacco. She reports that she does not currently use alcohol. She reports that she does not use drugs.  Patient was never married and has no children.  She lives at home alone with two Deneen Harts and [place].  She has a neighbor, Lawyer, who is her healthcare power of attorney.  Patient has a cousin from out of state.  ADVANCE DIRECTIVES:  On file  CODE STATUS: DNR  PAST MEDICAL HISTORY: Past Medical History:  Diagnosis Date   Anxiety    Arthritis    Asthma    Cancer ([place])    benign mass removed   Complication of anesthesia    Dyspnea    Dysrhythmia     Hyperlipidemia    Hypertension    Hypothyroidism    PONV (postoperative nausea and vomiting)    Pre-diabetes    Thyroid disease     PAST SURGICAL HISTORY:  Past Surgical History:  Procedure Laterality Date   COLONOSCOPY     FLEXIBLE BRONCHOSCOPY Bilateral 03/06/2021   Procedure: FLEXIBLE BRONCHOSCOPY;  Surgeon: [name], MD;  Location: [facility];  Service: Pulmonary;  Laterality: Bilateral;   PORTA CATH INSERTION N/A 03/13/2021   Procedure: PORTA CATH INSERTION;  Surgeon: [name], MD;  Location: [facility];  Service: Cardiovascular;  Laterality: N/A;   stent placed in kidney     THYROIDECTOMY      HEMATOLOGY/ONCOLOGY HISTORY:  Oncology History  Primary cancer of right middle lobe of lung ([place])  03/07/2021 Initial Diagnosis   Primary cancer of right middle lobe of lung ([place])   03/07/2021 Cancer Staging   Staging form: Lung, AJCC 8th Edition - Clinical stage from 03/07/2021: Stage IIIB (cT4, cN2, cM0) - Signed by [name], MD on 03/07/2021 Stage prefix: Initial diagnosis   03/15/2021 -  Chemotherapy    Patient is on Treatment Plan: LUNG CARBOPLATIN / PACLITAXEL + XRT Q7D        ALLERGIES:  is allergic to atorvastatin, latex, lovastatin, and penicillins.  MEDICATIONS:  Current Outpatient Medications  Medication Sig Dispense Refill   ALPRAZolam (XANAX) 0.25 MG tablet Take 1 tablet (0.25 mg total) by mouth 2 (two) times daily as needed for anxiety. 60 tablet 1   apixaban (ELIQUIS) 5 MG TABS tablet Take 1 tablet (5 mg  total) by mouth 2 (two) times daily. 60 tablet 5   Budeson-Glycopyrrol-Formoterol (BREZTRI AEROSPHERE) 160-9-4.8 MCG/ACT AERO Inhale 1 spray into the lungs 2 (two) times daily. 10.7 g 2   calcium-vitamin D (OSCAL WITH D) 500-200 MG-UNIT tablet Take 1 tablet by mouth 2 (two) times daily. 60 tablet 2   dexamethasone (DECADRON) 4 MG tablet Take 1 tablet (4 mg total) by mouth daily. 30 tablet 0   diltiazem (CARDIZEM CD) 240 MG 24 hr capsule Take 1  capsule (240 mg total) by mouth daily. To control your heart rate from Afib. 30 capsule 2   ezetimibe (ZETIA) 10 MG tablet TAKE 1 TABLET(10 MG) BY MOUTH EVERY DAY (Patient not taking: No sig reported)     fluconazole (DIFLUCAN) 150 MG tablet Take 1 tablet (150 mg total) by mouth daily. (Patient not taking: Reported on 04/26/2021) 7 tablet 0   fluticasone (FLONASE) 50 MCG/ACT nasal spray Place 1 spray into both nostrils in the morning and at bedtime. 15 mL 1   guaiFENesin-dextromethorphan (ROBITUSSIN DM) 100-10 MG/5ML syrup Take 10 mLs by mouth every 6 (six) hours as needed for cough. 118 mL 0   hydrochlorothiazide (HYDRODIURIL) 12.5 MG tablet Take 12.5 mg by mouth daily.     ipratropium (ATROVENT) 0.06 % nasal spray Place 2 sprays into both nostrils 3 (three) times daily.     ipratropium-albuterol (DUONEB) 0.5-2.5 (3) MG/3ML SOLN Take 3 mLs by nebulization every 6 (six) hours as needed. 120 mL 2   levocetirizine (XYZAL) 5 MG tablet Take 5 mg by mouth daily as needed.     levothyroxine (SYNTHROID) 112 MCG tablet Take 112 mcg by mouth daily.     lidocaine-prilocaine (EMLA) cream Apply 1 application topically as needed. 30 g 0   Multiple Vitamin (MULTI-VITAMIN) tablet Take 1 tablet by mouth daily.     ondansetron (ZOFRAN) 8 MG tablet Take 1 tablet (8 mg total) by mouth 2 (two) times daily as needed for refractory nausea / vomiting. 60 tablet 1   PARoxetine (PAXIL) 20 MG tablet Take 1.5 tablets (30 mg total) by mouth daily. 30 tablet 2   potassium chloride SA (KLOR-CON) 20 MEQ tablet Take 1 tablet (20 mEq total) by mouth daily. 30 tablet 0   prochlorperazine (COMPAZINE) 10 MG tablet TAKE 1 TABLET BY MOUTH EVERY 6 HOURS AS NEEDED FOR NAUSEA OR VOMITING 60 tablet 1   No current facility-administered medications for this visit.   Facility-Administered Medications Ordered in Other Visits  Medication Dose Route Frequency Provider Last Rate Last Admin   CARBOplatin (PARAPLATIN) 160 mg in sodium chloride  0.9 % 100 mL chemo infusion  160 mg Intravenous Once [name], MD       heparin lock flush 100 UNIT/ML injection            heparin lock flush 100 unit/mL  500 Units Intravenous Once [name], MD       PACLitaxel (TAXOL) 78 mg in sodium chloride 0.9 % 250 mL chemo infusion (</= 43m/m2)  45 mg/m2 (Treatment Plan Recorded) Intravenous Once FLloyd Huger MD       sodium chloride flush (NS) 0.9 % injection 10 mL  10 mL Intravenous PRN FLloyd Huger MD   10 mL at 04/12/21 0851   sodium chloride flush (NS) 0.9 % injection 10 mL  10 mL Intravenous PRN FLloyd Huger MD   10 mL at 04/26/21 08841   VITAL SIGNS: There were no vitals taken for this visit.  There were no vitals filed for this visit.  Estimated body mass index is 25.3 kg/m as calculated from the following:   Height as of 03/13/21: _0  (1.549 m).   Weight as of an earlier encounter on 04/26/21: 133 lb 14.4 oz (60.7 kg).  LABS: CBC:    Component Value Date/Time   WBC 1.5 (L) 04/26/2021 0942   HGB 9.7 (L) 04/26/2021 0942   HCT 29.3 (L) 04/26/2021 0942   PLT 169 04/26/2021 0942   MCV 87.2 04/26/2021 0942   NEUTROABS 1.1 (L) 04/26/2021 0942   LYMPHSABS 0.1 (L) 04/26/2021 0942   MONOABS 0.2 04/26/2021 0942   EOSABS 0.0 04/26/2021 0942   BASOSABS 0.0 04/26/2021 0942   Comprehensive Metabolic Panel:    Component Value Date/Time   NA 132 (L) 04/26/2021 0942   K 3.9 04/26/2021 0942   CL 95 (L) 04/26/2021 0942   CO2 25 04/26/2021 0942   BUN 27 (H) 04/26/2021 0942   CREATININE 0.78 04/26/2021 0942   GLUCOSE 202 (H) 04/26/2021 0942   CALCIUM 6.6 (L) 04/26/2021 0942   AST 21 04/26/2021 0942   ALT 51 (H) 04/26/2021 0942   ALKPHOS 58 04/26/2021 0942   BILITOT 0.5 04/26/2021 0942   PROT 6.2 (L) 04/26/2021 0942   ALBUMIN 3.1 (L) 04/26/2021 0942    RADIOGRAPHIC STUDIES: US Venous Img Upper Uni Left  Result Date: 03/29/2021 CLINICAL DATA:  left arm swelling/receiving treatment for lung  cancer EXAM: LEFT UPPER EXTREMITY VENOUS DOPPLER ULTRASOUND TECHNIQUE: Gray-scale sonography with graded compression, as well as color Doppler and duplex ultrasound were performed to evaluate the upper extremity deep venous system from the level of the subclavian vein and including the jugular, axillary, basilic, radial, ulnar and upper cephalic vein. Spectral Doppler was utilized to evaluate flow at rest and with distal augmentation maneuvers. COMPARISON:  None. FINDINGS: Contralateral Subclavian Vein: Respiratory phasicity is normal and symmetric with the symptomatic side. No evidence of thrombus. Normal compressibility. Internal Jugular Vein: No evidence of thrombus. Normal compressibility, respiratory phasicity and response to augmentation. Subclavian Vein: No evidence of thrombus. Normal compressibility, respiratory phasicity and response to augmentation. Axillary Vein: No evidence of thrombus. Normal compressibility, respiratory phasicity and response to augmentation. Cephalic Vein: No evidence of thrombus. Normal compressibility, respiratory phasicity and response to augmentation. Basilic Vein: No evidence of thrombus. Normal compressibility, respiratory phasicity and response to augmentation. Brachial Veins: No evidence of thrombus. Normal compressibility, respiratory phasicity and response to augmentation. Radial Veins: No evidence of thrombus. Normal compressibility, respiratory phasicity and response to augmentation. Ulnar Veins: No evidence of thrombus. Normal compressibility, respiratory phasicity and response to augmentation. Other Findings:  None visualized. IMPRESSION: No evidence of DVT within the left upper extremity. Electronically Signed   By: Albin Felling M.D.   On: 03/29/2021 14:17   MM 3D SCREEN BREAST BILATERAL  Result Date: 04/05/2021 CLINICAL DATA:  Screening. EXAM: DIGITAL SCREENING BILATERAL MAMMOGRAM WITH TOMOSYNTHESIS AND CAD TECHNIQUE: Bilateral screening digital craniocaudal and  mediolateral oblique mammograms were obtained. Bilateral screening digital breast tomosynthesis was performed. The images were evaluated with computer-aided detection. COMPARISON:  Previous exam(s). ACR Breast Density Category b: There are scattered areas of fibroglandular density. FINDINGS: In the right breast, a possible asymmetry warrants further evaluation. In the left breast, no findings suspicious for malignancy. IMPRESSION: Further evaluation is suggested for possible asymmetry in the right breast. RECOMMENDATION: Diagnostic mammogram and possibly ultrasound of the right breast. (Code:[id number]-58M) The patient will be contacted regarding the findings, and additional imaging will  be scheduled. BI-RADS CATEGORY  0: Incomplete. Need additional imaging evaluation and/or prior mammograms for comparison. Electronically Signed   By: Audie Pinto M.D.   On: 04/05/2021 16:19  LONG TERM MONITOR (3-14 DAYS)  Result Date: 04/07/2021 Patch Wear Time:  14 days and 0 hours (2022-08-10T17:30:29-0400 to 2022-08-24T17:30:33-0400) Patient had a min HR of 51 bpm, max HR of 176 bpm, and avg HR of 92 bpm. Predominant underlying rhythm was Sinus Rhythm. 8 Supraventricular Tachycardia runs occurred, the run with the fastest interval lasting 9 beats with a max rate of 176 bpm, the longest lasting 18 beats with an avg rate of 125 bpm. Atrial Fibrillation occurred (22% burden), ranging from 54-169 bpm (avg of 92 bpm), the longest lasting 3 hours 54 mins with an avg rate of 86 bpm.   MM Outside Films Mammo  Result Date: 04/04/2021 This examination belongs to an outside facility and is stored here for comparison purposes only.  Contact the originating outside institution for any associated report or interpretation.   PERFORMANCE STATUS (ECOG) : 2 - Symptomatic, <50% confined to bed  Review of Systems Unless otherwise noted, a complete review of systems is negative.  Physical Exam General: NAD Pulmonary:  Unlabored Extremities: no edema, no joint deformities Skin: no rashes Neurological: Weakness but otherwise nonfocal  IMPRESSION: Follow-up visit.  Patient seen in infusion.  Patient reports that she is doing well.  She denies significant changes or concerns.    Patient is planning to move to [place] in two weeks to live with her cousin. She plans to establish care locally with oncology.   No acute needs identified today.   PLAN: -Continue current scope of treatment -Patient pending move to [place] to live with family.  -RTC as needed  Case and plan discussed with Dr. [name]    Patient expressed understanding and was in agreement with this plan. She also understands that She can call the clinic at any time with any questions, concerns, or complaints.     Time Total: 10 minutes  Visit consisted of counseling and education dealing with the complex and emotionally intense issues of symptom management and palliative care in the setting of serious and potentially life-threatening illness.Greater than 50%  of this time was spent counseling and coordinating care related to the above assessment and plan.  Signed by: Altha Harm, PhD, NP-C

## 2021-04-26 NOTE — Patient Instructions (Signed)
[facility]   Discharge Instructions: Thank you for choosing [facility] to provide your oncology and hematology care.  If you have a lab appointment with the [facility], please go directly to the [facility] and check in at the registration area.  Wear comfortable clothing and clothing appropriate for easy access to any Portacath or PICC line.   We strive to give you quality time with your provider. You may need to reschedule your appointment if you arrive late (15 or more minutes).  Arriving late affects you and other patients whose appointments are after yours.  Also, if you miss three or more appointments without notifying the office, you may be dismissed from the clinic at the provider's discretion.      For prescription refill requests, have your pharmacy contact our office and allow 72 hours for refills to be completed.    Today you received the following chemotherapy and/or immunotherapy agents: Taxol and Carboplatin.      To help prevent nausea and vomiting after your treatment, we encourage you to take your nausea medication as directed.  BELOW ARE SYMPTOMS THAT SHOULD BE REPORTED IMMEDIATELY: *FEVER GREATER THAN 100.4 F (38 C) OR HIGHER *CHILLS OR SWEATING *NAUSEA AND VOMITING THAT IS NOT CONTROLLED WITH YOUR NAUSEA MEDICATION *UNUSUAL SHORTNESS OF BREATH *UNUSUAL BRUISING OR BLEEDING *URINARY PROBLEMS (pain or burning when urinating, or frequent urination) *BOWEL PROBLEMS (unusual diarrhea, constipation, pain near the anus) TENDERNESS IN MOUTH AND THROAT WITH OR WITHOUT PRESENCE OF ULCERS (sore throat, sores in mouth, or a toothache) UNUSUAL RASH, SWELLING OR PAIN  UNUSUAL VAGINAL DISCHARGE OR ITCHING   Items with * indicate a potential emergency and should be followed up as soon as possible or go to the Emergency Department if any problems should occur.  Please show the CHEMOTHERAPY ALERT CARD or IMMUNOTHERAPY ALERT  CARD at check-in to the Emergency Department and triage nurse.  Should you have questions after your visit or need to cancel or reschedule your appointment, please contact [facility]  [phone] and follow the prompts.  Office hours are 8:00 a.m. to 4:30 p.m. Monday - Friday. Please note that voicemails left after 4:00 p.m. may not be returned until the following business day.  We are closed weekends and major holidays. You have access to a nurse at all times for urgent questions. Please call the main number to the clinic [phone] and follow the prompts.  For any non-urgent questions, you may also contact your provider using MyChart. We now offer e-Visits for anyone 41 and older to request care online for non-urgent symptoms. For details visit [email or web address].   Also download the MyChart app! Go to the app store, search "MyChart", open the app, select , and log in with your MyChart username and password.  Due to Covid, a mask is required upon entering the hospital/clinic. If you do not have a mask, one will be given to you upon arrival. For doctor visits, patients may have 1 support person aged 69 or older with them. For treatment visits, patients cannot have anyone with them due to current Covid guidelines and our immunocompromised population.

## 2021-04-26 NOTE — Telephone Encounter (Signed)
error 

## 2021-04-27 ENCOUNTER — Ambulatory Visit
Admission: RE | Admit: 2021-04-27 | Discharge: 2021-04-27 | Disposition: A | Discharge status: Home / Self Care | Payer: Medicare Other | Source: Ambulatory Visit | Attending: Radiation Oncology | Admitting: Radiation Oncology

## 2021-04-27 DIAGNOSIS — R918 Other nonspecific abnormal finding of lung field: Secondary | ICD-10-CM | POA: Diagnosis not present

## 2021-04-28 ENCOUNTER — Encounter: Payer: Self-pay | Admitting: Oncology

## 2021-05-06 DEATH — deceased

## 2021-06-01 ENCOUNTER — Ambulatory Visit: Payer: Medicare Other | Admitting: Radiation Oncology

## 2021-06-13 ENCOUNTER — Ambulatory Visit: Payer: Medicare Other | Admitting: Cardiology

## 2022-04-20 IMAGING — MG MM DIGITAL SCREENING BILAT W/ TOMO AND CAD
8 series · 8 of 24 positions shown · non-contrast
Comparison: Previous exam(s).

CLINICAL DATA: Screening.

EXAM:
DIGITAL SCREENING BILATERAL MAMMOGRAM WITH TOMOSYNTHESIS AND CAD
TECHNIQUE: Bilateral screening digital craniocaudal and mediolateral oblique
mammograms were obtained. Bilateral screening digital breast
tomosynthesis was performed. The images were evaluated with
computer-aided detection.

[L CC synth-2D]
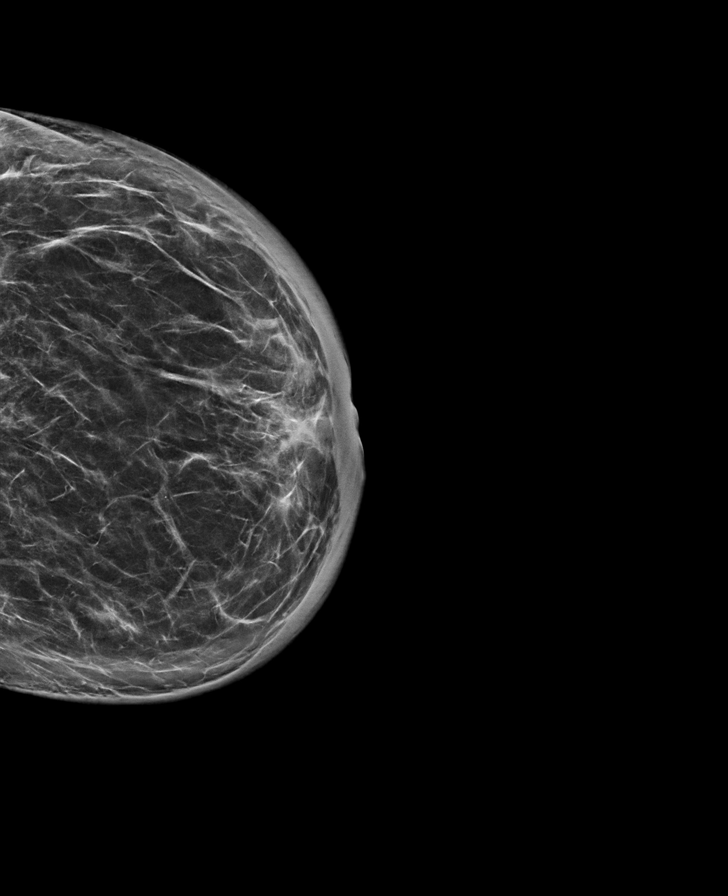

[R CC synth-2D]
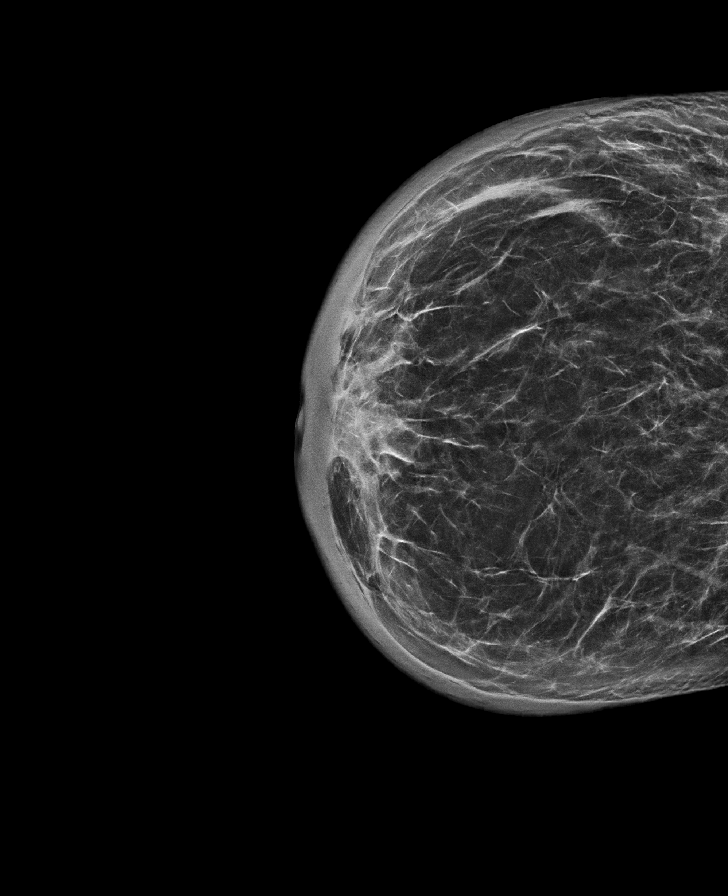

[L MLO synth-2D]
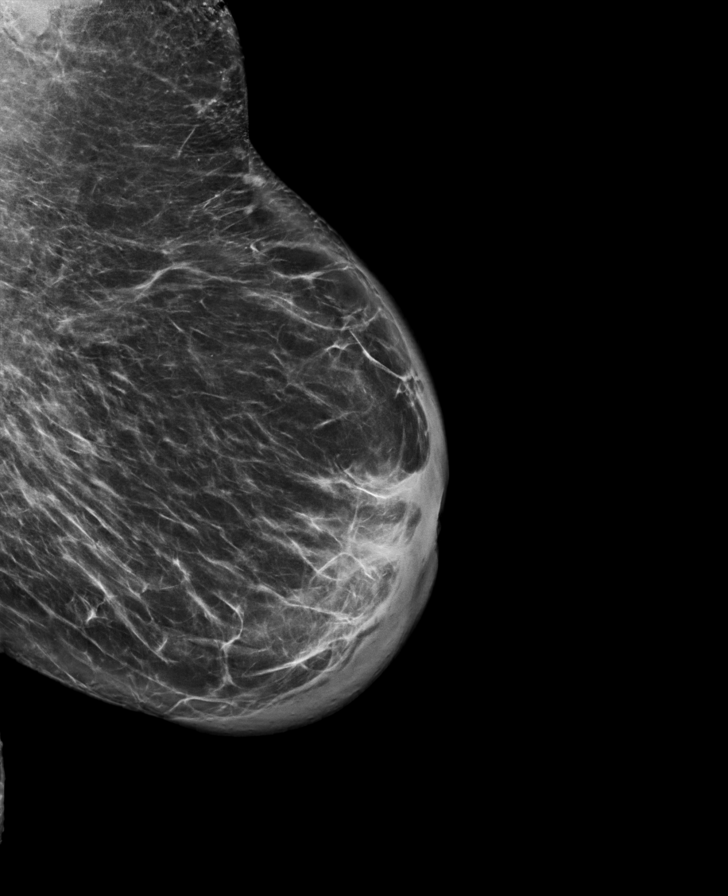

[R MLO synth-2D]
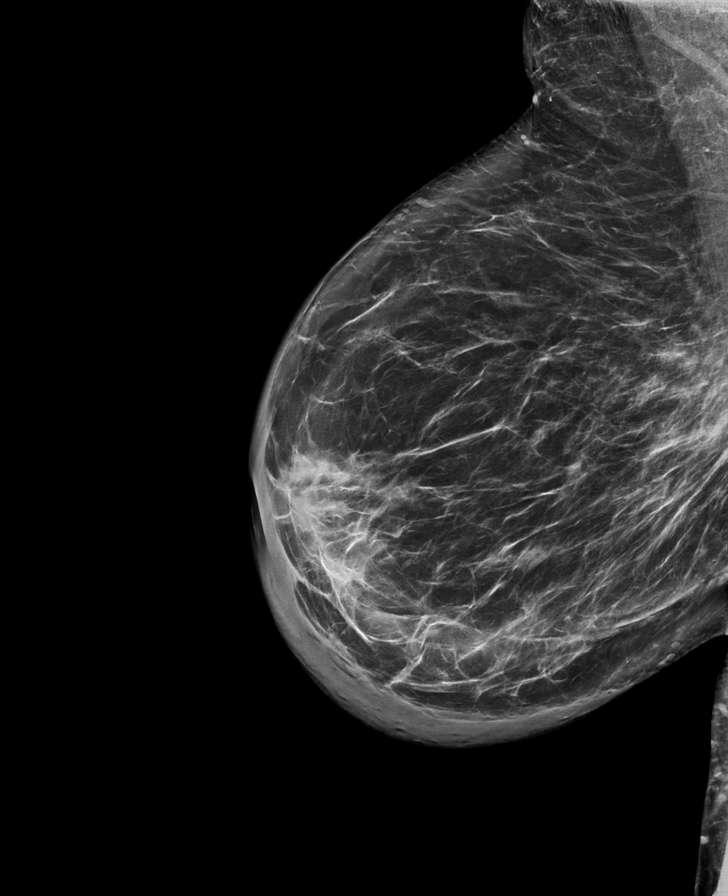

[R MLO tomo · tomo slice 47/94.0]
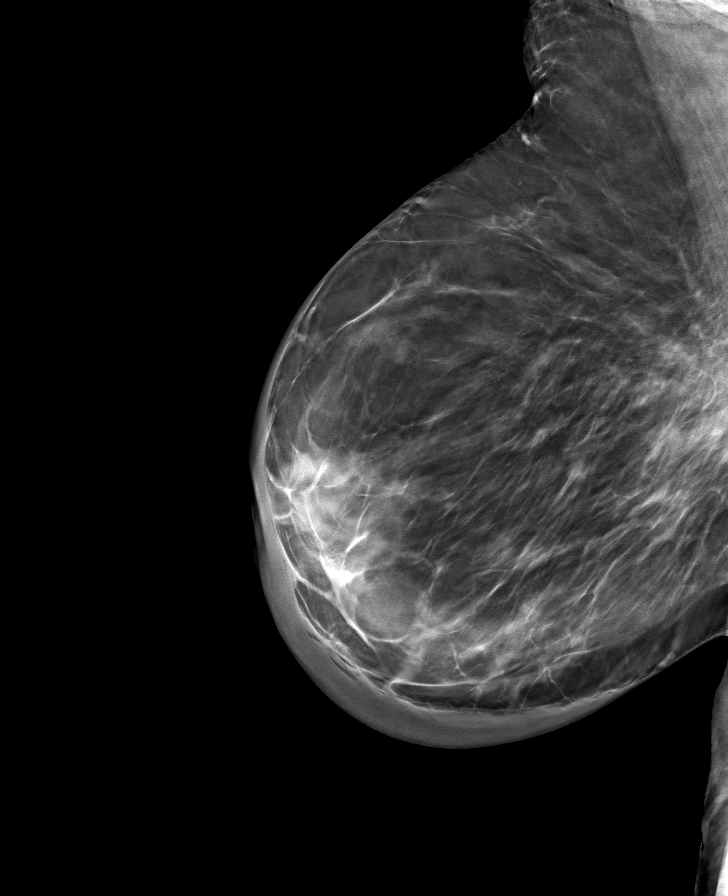

[L CC tomo · tomo slice 46/91.0]
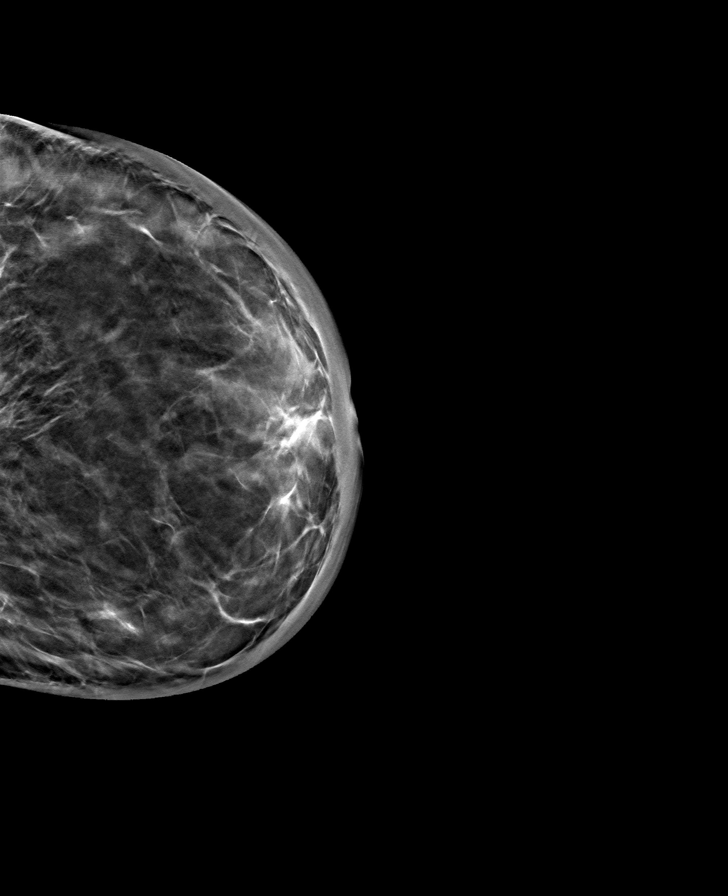

[R CC tomo · tomo slice 43/84.0]
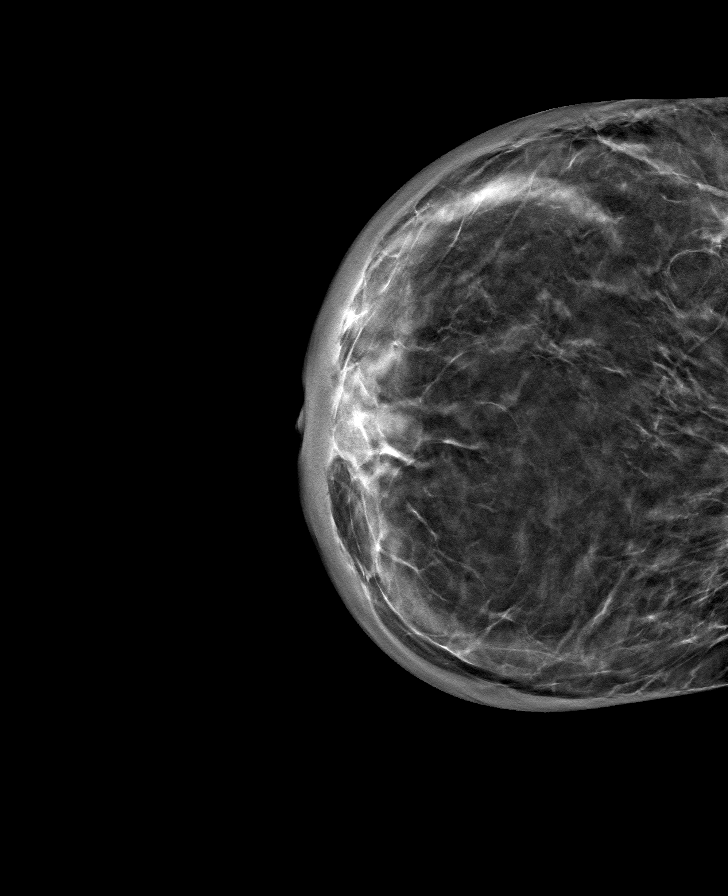

[L MLO tomo · tomo slice 49/96.0]
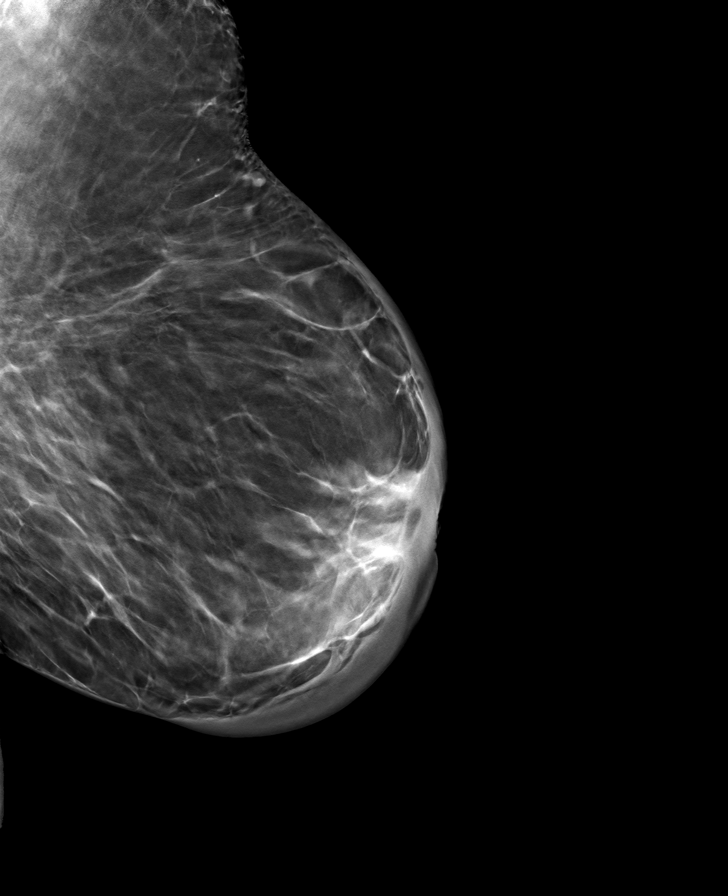

[8 of 24 positions shown; findings below may reference images not displayed]

ACR Breast Density Category b: There are scattered areas of
fibroglandular density.
FINDINGS: In the right breast, a possible asymmetry warrants further
evaluation. In the left breast, no findings suspicious for
malignancy.
IMPRESSION: Further evaluation is suggested for possible asymmetry in the right
breast.

RECOMMENDATION:
Diagnostic mammogram and possibly ultrasound of the right breast.
(Code:[id number])

The patient will be contacted regarding the findings, and additional
imaging will be scheduled.

BI-RADS CATEGORY  0: Incomplete. Need additional imaging evaluation
and/or prior mammograms for comparison.
